# Patient Record
Sex: Female | Born: 1985 | Race: Black or African American | Hispanic: No | Marital: Single | Smoking: Former smoker
Health system: Southern US, Community
[De-identification: ages and names within clinical notes are randomized; demographics above are authoritative.]

## PROBLEM LIST (undated history)

## (undated) ENCOUNTER — Inpatient Hospital Stay (HOSPITAL_COMMUNITY): Payer: Self-pay

## (undated) DIAGNOSIS — G43909 Migraine, unspecified, not intractable, without status migrainosus: Secondary | ICD-10-CM

## (undated) DIAGNOSIS — F329 Major depressive disorder, single episode, unspecified: Secondary | ICD-10-CM

## (undated) DIAGNOSIS — F419 Anxiety disorder, unspecified: Secondary | ICD-10-CM

## (undated) DIAGNOSIS — D369 Benign neoplasm, unspecified site: Secondary | ICD-10-CM

## (undated) DIAGNOSIS — E282 Polycystic ovarian syndrome: Secondary | ICD-10-CM

## (undated) DIAGNOSIS — R87629 Unspecified abnormal cytological findings in specimens from vagina: Secondary | ICD-10-CM

## (undated) DIAGNOSIS — Z8249 Family history of ischemic heart disease and other diseases of the circulatory system: Secondary | ICD-10-CM

## (undated) DIAGNOSIS — E669 Obesity, unspecified: Secondary | ICD-10-CM

## (undated) DIAGNOSIS — F32A Depression, unspecified: Secondary | ICD-10-CM

## (undated) DIAGNOSIS — I10 Essential (primary) hypertension: Secondary | ICD-10-CM

## (undated) DIAGNOSIS — J302 Other seasonal allergic rhinitis: Secondary | ICD-10-CM

## (undated) HISTORY — PX: ADENOIDECTOMY: SUR15

## (undated) HISTORY — DX: Essential (primary) hypertension: I10

## (undated) HISTORY — DX: Family history of ischemic heart disease and other diseases of the circulatory system: Z82.49

## (undated) HISTORY — PX: TONSILLECTOMY: SUR1361

---

## 2007-03-27 ENCOUNTER — Other Ambulatory Visit: Admission: RE | Admit: 2007-03-27 | Discharge: 2007-03-27 | Payer: Self-pay | Admitting: Obstetrics and Gynecology

## 2008-10-05 ENCOUNTER — Emergency Department (HOSPITAL_COMMUNITY): Admission: EM | Admit: 2008-10-05 | Discharge: 2008-10-05 | Payer: Self-pay | Admitting: Emergency Medicine

## 2008-12-07 ENCOUNTER — Emergency Department (HOSPITAL_COMMUNITY): Admission: EM | Admit: 2008-12-07 | Discharge: 2008-12-07 | Payer: Self-pay | Admitting: Family Medicine

## 2009-10-27 ENCOUNTER — Emergency Department (HOSPITAL_COMMUNITY): Admission: EM | Admit: 2009-10-27 | Discharge: 2009-10-27 | Payer: Self-pay | Admitting: Family Medicine

## 2010-05-31 LAB — CULTURE, ROUTINE-ABSCESS

## 2010-07-11 ENCOUNTER — Ambulatory Visit (HOSPITAL_COMMUNITY): Payer: Self-pay

## 2010-08-14 ENCOUNTER — Inpatient Hospital Stay (INDEPENDENT_AMBULATORY_CARE_PROVIDER_SITE_OTHER)
Admission: RE | Admit: 2010-08-14 | Discharge: 2010-08-14 | Disposition: A | Discharge status: Home / Self Care | Payer: 59 | Source: Ambulatory Visit | Attending: Emergency Medicine | Admitting: Emergency Medicine

## 2010-08-14 DIAGNOSIS — L259 Unspecified contact dermatitis, unspecified cause: Secondary | ICD-10-CM

## 2011-05-27 ENCOUNTER — Encounter (HOSPITAL_COMMUNITY): Payer: Self-pay | Admitting: Emergency Medicine

## 2011-05-27 ENCOUNTER — Emergency Department (INDEPENDENT_AMBULATORY_CARE_PROVIDER_SITE_OTHER)
Admission: EM | Admit: 2011-05-27 | Discharge: 2011-05-27 | Disposition: A | Discharge status: Home / Self Care | Payer: 59 | Source: Home / Self Care | Attending: Emergency Medicine | Admitting: Emergency Medicine

## 2011-05-27 DIAGNOSIS — J329 Chronic sinusitis, unspecified: Secondary | ICD-10-CM

## 2011-05-27 HISTORY — DX: Obesity, unspecified: E66.9

## 2011-05-27 HISTORY — DX: Other seasonal allergic rhinitis: J30.2

## 2011-05-27 HISTORY — DX: Migraine, unspecified, not intractable, without status migrainosus: G43.909

## 2011-05-27 MED ORDER — PSEUDOEPHEDRINE-GUAIFENESIN ER 120-1200 MG PO TB12: 1.0000 | ORAL_TABLET | Freq: Two times a day (BID) | ORAL | Status: DC | PRN

## 2011-05-27 MED ORDER — FLUTICASONE PROPIONATE 50 MCG/ACT NA SUSP: 2.0000 | Freq: Every day | NASAL | Status: DC

## 2011-05-27 NOTE — ED Provider Notes (Signed)
History     CSN: [id number]  Arrival date & time 05/27/11  4098   First MD Initiated Contact with Patient 05/27/11 1817      Chief Complaint  Patient presents with  . URI    (Consider location/radiation/quality/duration/timing/severity/associated sxs/prior treatment) HPI Comments: Pt with sensation of sinus pressure, facial fullness for 2 months. States she feels like her nose is congested, but is "unable to get anything out". Mild postnasal drip, and irritated throat at the beginning. Symptoms are not aggravated by head movement. There are no other aggravating or alleviating symptoms. Patient initially tried Sudafed, states that "dried her up" and then switched to a nonsedating antihistamine without relief.  No coughing, ear pain, ear fullness, fevers, N/V, other HA, bodyaches, dental pain, purulent nasal d/c. Patient has a history seasonal allergies, allergic rhinitis. States that her allergies to get worse around this time of year.    ROS as noted in HPI. All other ROS negative.   Patient is a 26 y.o. female presenting with sinusitis. The history is provided by the patient. No language interpreter was used.  Sinusitis  This is a recurrent problem. The current episode started more than 1 week ago. The problem has not changed since onset.There has been no fever. Associated symptoms include congestion and sinus pressure. She has tried other medications for the symptoms.    Past Medical History  Diagnosis Date  . Seasonal allergies   . Migraines   . Obesity     Past Surgical History  Procedure Date  . Tonsillectomy   . Adenoidectomy     History reviewed. No pertinent family history.  History  Substance Use Topics  . Smoking status: Current Everyday Smoker  . Smokeless tobacco: Not on file  . Alcohol Use: Yes    OB History    Grav Para Term Preterm Abortions TAB SAB Ect Mult Living                  Review of Systems  HENT: Positive for congestion and sinus  pressure.     Allergies  Review of patient's allergies indicates no known allergies.  Home Medications   Current Outpatient Rx  Name Route Sig Dispense Refill  . FLUTICASONE PROPIONATE 50 MCG/ACT NA SUSP Nasal Place 2 sprays into the nose daily. 16 g 0  . PSEUDOEPHEDRINE-GUAIFENESIN ER 425 042 3803 MG PO TB12 Oral Take 1 tablet by mouth 2 (two) times daily as needed (congestion). 20 each 0    BP 130/97  Pulse 86  Temp(Src) 100 F (37.8 C) (Oral)  Resp 22  SpO2 100%  LMP 05/11/2011  Physical Exam  Nursing note and vitals reviewed. Constitutional: She is oriented to person, place, and time. She appears well-developed and well-nourished. No distress.  HENT:  Head: Normocephalic and atraumatic.  Right Ear: Tympanic membrane normal.  Left Ear: Tympanic membrane normal.  Nose: Mucosal edema present. No rhinorrhea or septal deviation. Right sinus exhibits no maxillary sinus tenderness and no frontal sinus tenderness. Left sinus exhibits no maxillary sinus tenderness and no frontal sinus tenderness.  Mouth/Throat: Uvula is midline, oropharynx is clear and moist and mucous membranes are normal.  Eyes: Conjunctivae and EOM are normal. Pupils are equal, round, and reactive to light.  Neck: Normal range of motion. Neck supple.  Cardiovascular: Normal rate, regular rhythm and normal heart sounds.   Pulmonary/Chest: Effort normal and breath sounds normal.  Abdominal: She exhibits no distension.  Musculoskeletal: Normal range of motion.  Neurological: She is alert and oriented  to person, place, and time.  Skin: Skin is warm and dry.  Psychiatric: She has a normal mood and affect. Her behavior is normal. Judgment and thought content normal.    ED Course  Procedures (including critical care time)  Labs Reviewed - No data to display No results found.   1. Sinusitis       MDM  No signs of bacterial infection, even though patient has been reporting symptoms of 2 months.. Patient has a  primary care physician for followup. Will send her home with Mucinex D, Flonase, saline nasal irrigation. She will follow with her primary care physician in 4-5 days for reevaluation and antibiotic prescription of that time if needed. Counseled patient on smoking cessation. Patient agrees with plan.  Luiz Blare, MD 05/27/11 2044

## 2011-05-27 NOTE — ED Notes (Signed)
Sinus pressure, pressure behind nose.  Blows nose, but nothing moves, tries hacking phlegm up, but will not move.  Patient reports sinus issues for 2 months.  No fever.  No nausea, no vomiting, no diarrhea.

## 2011-05-27 NOTE — Discharge Instructions (Signed)
Take the medication as written. Return if you get worse, have a fever >100.4, or for any concerns. You may take 600 mg of motrin with 1 gram of tylenol up to 4 times a day as needed for pain. This is an effective combination for pain. Use a neti pot or the NeilMed sinus rinse as often as you want to to reduce nasal congestion. Follow the directions on the box.  ° °Go to www.goodrx.com to look up your medications. This will give you a list of where you can find your prescriptions at the most affordable prices.  °

## 2011-08-30 ENCOUNTER — Ambulatory Visit: Payer: 59 | Admitting: Physician Assistant

## 2011-08-30 VITALS — BP 150/84 | HR 89 | Temp 99.5°F | Resp 18 | Ht 68.5 in | Wt 362.2 lb

## 2011-08-30 DIAGNOSIS — Z2089 Contact with and (suspected) exposure to other communicable diseases: Secondary | ICD-10-CM

## 2011-08-30 DIAGNOSIS — R350 Frequency of micturition: Secondary | ICD-10-CM

## 2011-08-30 DIAGNOSIS — Z202 Contact with and (suspected) exposure to infections with a predominantly sexual mode of transmission: Secondary | ICD-10-CM

## 2011-08-30 DIAGNOSIS — N898 Other specified noninflammatory disorders of vagina: Secondary | ICD-10-CM

## 2011-08-30 LAB — POCT URINALYSIS DIPSTICK
Bilirubin, UA: NEGATIVE
Blood, UA: NEGATIVE
Glucose, UA: NEGATIVE
Ketones, UA: NEGATIVE
Nitrite, UA: NEGATIVE
Protein, UA: NEGATIVE
Spec Grav, UA: 1.025
Urobilinogen, UA: 0.2
pH, UA: 6

## 2011-08-30 LAB — POCT UA - MICROSCOPIC ONLY
Bacteria, U Microscopic: 1
Casts, Ur, LPF, POC: NEGATIVE
Crystals, Ur, HPF, POC: NEGATIVE
Mucus, UA: NEGATIVE
Yeast, UA: NEGATIVE

## 2011-08-30 LAB — POCT WET PREP WITH KOH
KOH Prep POC: POSITIVE
RBC Wet Prep HPF POC: NEGATIVE
Trichomonas, UA: NEGATIVE
Yeast Wet Prep HPF POC: POSITIVE

## 2011-08-30 MED ORDER — FLUCONAZOLE 150 MG PO TABS: 150.0000 mg | ORAL_TABLET | Freq: Once | ORAL | Status: AC

## 2011-08-30 NOTE — Progress Notes (Signed)
Subjective:    Patient ID: Miranda Stewart, female    DOB: 11/13/1985, 26 y.o.   MRN: [id number]  HPI Patient presents with 1 week history of vaginal itching and some burning noticed mostly when she urinates. Says she was treated for a yeast infection about 1 month ago following antibiotic use. She used OTC Monistat which helped. After menses, symptoms restarted. Denies abdominal pain, nausea, vomiting, back pain, fever, chills, or vaginal discharge. No specific concern for STI's but wants to be tested today. She gets annual pap tests from her gynecologists.     Review of Systems  Constitutional: Negative for fever and chills.  Genitourinary: Positive for dysuria and frequency. Negative for hematuria, flank pain, vaginal bleeding, vaginal discharge and vaginal pain.  Skin: Negative for rash.       Objective:   Physical Exam  Constitutional: She is oriented to person, place, and time. She appears well-developed and well-nourished.  HENT:  Head: Normocephalic and atraumatic.  Right Ear: External ear normal.  Left Ear: External ear normal.  Eyes: Conjunctivae are normal.  Neck: Normal range of motion.  Cardiovascular: Normal rate, regular rhythm and normal heart sounds.   Pulmonary/Chest: Effort normal and breath sounds normal.  Abdominal: Soft. Bowel sounds are normal. There is no rebound.  Genitourinary: Uterus normal. Pelvic exam was performed with patient supine. There is no rash or tenderness on the right labia. There is no rash or tenderness on the left labia. Cervix exhibits no motion tenderness and no friability. Right adnexum displays no tenderness. Left adnexum displays no tenderness. Vaginal discharge (thin, whitish ) found.  Neurological: She is alert and oriented to person, place, and time.  Psychiatric: She has a normal mood and affect. Her behavior is normal. Judgment and thought content normal.    Results for orders placed in visit on 08/30/11  POCT WET PREP WITH KOH      Component Value Range   Trichomonas, UA Negative     Clue Cells Wet Prep HPF POC 1-4     Epithelial Wet Prep HPF POC 2-3     Yeast Wet Prep HPF POC positive     Bacteria Wet Prep HPF POC trace     RBC Wet Prep HPF POC neg     WBC Wet Prep HPF POC 6-8     KOH Prep POC Positive    POCT URINALYSIS DIPSTICK      Component Value Range   Color, UA yellow     Clarity, UA clear     Glucose, UA neg     Bilirubin, UA neg     Ketones, UA neg     Spec Grav, UA 1.025     Blood, UA neg     pH, UA 6.0     Protein, UA neg     Urobilinogen, UA 0.2     Nitrite, UA neg     Leukocytes, UA small (1+)    POCT UA - MICROSCOPIC ONLY      Component Value Range   WBC, Ur, HPF, POC 15-20     RBC, urine, microscopic 0-2     Bacteria, U Microscopic 1     Mucus, UA neg     Epithelial cells, urine per micros 3-8     Crystals, Ur, HPF, POC neg     Casts, Ur, LPF, POC neg'     Yeast, UA neg           Assessment & Plan:   1. Urinary  frequency  Will send urine culture. Treat if necessary.  POCT urinalysis dipstick, POCT UA - Microscopic Only  2. Leukorrhea  Will treat for yeast infection.  POCT Wet Prep with KOH  3. Contact with or exposure to venereal disease  GC/chlamydia probe amp, genital, HIV antibody, RPR

## 2011-08-31 LAB — HIV ANTIBODY (ROUTINE TESTING W REFLEX): HIV: NONREACTIVE

## 2011-09-01 LAB — GC/CHLAMYDIA PROBE AMP, GENITAL
Chlamydia, DNA Probe: NEGATIVE
GC Probe Amp, Genital: NEGATIVE

## 2011-09-01 LAB — RPR

## 2011-09-02 LAB — URINE CULTURE: Colony Count: 35000

## 2013-03-24 ENCOUNTER — Emergency Department (HOSPITAL_COMMUNITY)
Admission: EM | Admit: 2013-03-24 | Discharge: 2013-03-24 | Disposition: A | Discharge status: Home / Self Care | Payer: 59 | Attending: Emergency Medicine | Admitting: Emergency Medicine

## 2013-03-24 ENCOUNTER — Encounter (HOSPITAL_COMMUNITY): Payer: Self-pay | Admitting: Emergency Medicine

## 2013-03-24 DIAGNOSIS — L0291 Cutaneous abscess, unspecified: Secondary | ICD-10-CM

## 2013-03-24 DIAGNOSIS — Z79899 Other long term (current) drug therapy: Secondary | ICD-10-CM | POA: Insufficient documentation

## 2013-03-24 DIAGNOSIS — E669 Obesity, unspecified: Secondary | ICD-10-CM | POA: Insufficient documentation

## 2013-03-24 DIAGNOSIS — F17200 Nicotine dependence, unspecified, uncomplicated: Secondary | ICD-10-CM | POA: Insufficient documentation

## 2013-03-24 DIAGNOSIS — IMO0002 Reserved for concepts with insufficient information to code with codable children: Secondary | ICD-10-CM | POA: Insufficient documentation

## 2013-03-24 DIAGNOSIS — Z8679 Personal history of other diseases of the circulatory system: Secondary | ICD-10-CM | POA: Insufficient documentation

## 2013-03-24 MED ORDER — IBUPROFEN 800 MG PO TABS: 800.0000 mg | ORAL_TABLET | Freq: Three times a day (TID) | ORAL | Status: DC

## 2013-03-24 MED ORDER — HYDROCODONE-ACETAMINOPHEN 5-325 MG PO TABS
1.0000 | ORAL_TABLET | Freq: Once | ORAL | Status: AC
  Administered 2013-03-24: 1 via ORAL
  Filled 2013-03-24: qty 1

## 2013-03-24 NOTE — ED Provider Notes (Signed)
Medical screening examination/treatment/procedure(s) were conducted as a shared visit with non-physician practitioner(s) or resident  and myself.  I personally evaluated the patient during the encounter and agree with the findings and plan unless otherwise indicated.    I have personally reviewed any xrays and/ or EKG's with the provider and I agree with interpretation.   EMERGENCY DEPARTMENT US SOFT TISSUE INTERPRETATION "Study: Limited Soft Tissue Ultrasound"  INDICATIONS: Pain and Soft tissue infection Multiple views of the body part were obtained in real-time with a multi-frequency linear probe PERFORMED BY:  Myself IMAGES ARCHIVED?: Yes SIDE:Right  BODY PART:Axilla FINDINGS: Abcess present and Cellulitis absent INTERPRETATION:  Abcess present and No cellulitis noted    Hx of abscess. Worsening right axillary swelling/ pain. No fever/ vomiting or systemic sxs.  Pt on bactrim.  Exam fluctuant/ tender, mild erythema right axilla consistent with likely abscess.  Bedside US confirmed extent of fluid, I personally performed US and planned I and D with PA.  I and D in ER, no complications, fup closely outpt discussed.  Filed Vitals:   03/24/13 0914  BP: 144/68  Pulse: 100  Temp: 98.6 F (37 C)  TempSrc: Oral  Resp: 14  SpO2: 100%    Right axillary abscess  Mariea Clonts, MD 03/26/13 2010

## 2013-03-24 NOTE — ED Notes (Signed)
She states she has a right axilla abscess, which she has had for over a week now.  She states she went to a walk-in clinic this week and "they cut it and gave me antibiotics, but it's no better".  She is in no distress.

## 2013-03-24 NOTE — Discharge Instructions (Signed)
Call for a follow up appointment with a Family or Primary Care Provider.  Return to the ED or your Primary care for a wound check in 2-3 days. Return if Symptoms worsen.   Take medication as prescribed.  Continue taking your Bactrim.

## 2013-03-24 NOTE — ED Notes (Signed)
PA at bedside.

## 2013-03-24 NOTE — ED Provider Notes (Signed)
CSN: [id number]     Arrival date & time 03/24/13  0907 History   First MD Initiated Contact with Patient 03/24/13 [id number]     Chief Complaint  Patient presents with  . Abscess   (Consider location/radiation/quality/duration/timing/severity/associated sxs/prior Treatment) HPI Comments: Miranda Stewart is a 28 y.o. year-old female with a past medical history of abscess, obesity, seasonal allergies, presenting the Emergency Department with a chief complaint of worsening abscess abscess for 1.5 weeks.  The patient reports pain and swelling in the Right Axilla.  She reports going to a clinic for treatment 6 days ago, which was an unsuccessful I&D and was placed on Bactrim.  She reports she was compliant with the antibiotic but has persistent swelling and pain.  Denies fever, chills, nausea, vomiting.   Patient is a 28 y.o. female presenting with abscess. The history is provided by the patient and medical records. No language interpreter was used.  Abscess Associated symptoms: no fever     Past Medical History  Diagnosis Date  . Seasonal allergies   . Migraines   . Obesity    Past Surgical History  Procedure Laterality Date  . Tonsillectomy    . Adenoidectomy     History reviewed. No pertinent family history. History  Substance Use Topics  . Smoking status: Current Some Day Smoker    Types: Cigarettes  . Smokeless tobacco: Not on file  . Alcohol Use: Yes   OB History   Grav Para Term Preterm Abortions TAB SAB Ect Mult Living                 Review of Systems  Constitutional: Negative for fever and chills.  Skin: Positive for wound.    Allergies  Doxycycline  Home Medications   Current Outpatient Rx  Name  Route  Sig  Dispense  Refill  . ferrous fumarate (HEMOCYTE - 106 MG FE) 325 (106 FE) MG TABS   Oral   Take 1 tablet by mouth.         Gwenlyn Saran 3-0.02 MG tablet   Oral   Take 1 tablet by mouth daily.          Marland Kitchen sulfamethoxazole-trimethoprim (BACTRIM DS) 800-160  MG per tablet   Oral   Take 1 tablet by mouth 2 (two) times daily.          BP 144/68  Pulse 100  Temp(Src) 98.6 F (37 C) (Oral)  Resp 14  SpO2 100%  LMP 03/02/2013 Physical Exam  Nursing note and vitals reviewed. Constitutional: She appears well-developed and well-nourished.  HENT:  Head: Normocephalic and atraumatic.  Neck: Neck supple.  Cardiovascular: Normal rate.   Pulmonary/Chest: Effort normal. No respiratory distress.  Neurological: She is alert.  Skin: Skin is warm and dry.  Right Axilla: 4x3 cm area of fluctuence with associated 10x10cm area of induration. Minimal overlying erythema. Moderately tender to palpation  Psychiatric: She has a normal mood and affect. Her behavior is normal. Judgment and thought content normal.    ED Course  INCISION AND DRAINAGE Date/Time: 03/24/2013 11:46 AM Performed by: Lorrine Kin Authorized by: Lorrine Kin Consent: Verbal consent obtained. Risks and benefits: risks, benefits and alternatives were discussed Consent given by: patient Patient understanding: patient states understanding of the procedure being performed Patient consent: the patient's understanding of the procedure matches consent given Procedure consent: procedure consent matches procedure scheduled Imaging studies: imaging studies available Required items: required blood products, implants, devices, and special equipment available Patient identity  confirmed: verbally with patient Time out: Immediately prior to procedure a "time out" was called to verify the correct patient, procedure, equipment, support staff and site/side marked as required. Type: abscess Body area: upper extremity (Right Axilla) Anesthesia: local infiltration Local anesthetic: lidocaine 2% with epinephrine Anesthetic total: 20 ml Patient sedated: no Scalpel size: 11 Needle gauge: 22 Incision type: single straight Complexity: complex Drainage: purulent and bloody (Aproximately  25cc) Drainage amount: copious Wound treatment: wound left open Packing material: 1/4 in iodoform gauze Patient tolerance: Patient tolerated the procedure well with no immediate complications. Comments:     (including critical care time) Labs Review Labs Reviewed - No data to display Imaging Review No results found.  EKG Interpretation   None       MDM   1. Abscess    Pt with large abscess to right axilla, I&D with a large amount of purulent discharge.  Wound packed and wound check in 2-3 days.  Request non-narcotic as pain relief.  Discussed and treatment plan of wound check in 2-3 days with removal of packing with the patient. Return precautions given. Reports understanding and no other concerns at this time.  Patient is stable for discharge at this time.      Lorrine Kin, PA-C 03/28/13 1226

## 2013-03-29 NOTE — ED Provider Notes (Signed)
See separate note Medical screening examination/treatment/procedure(s) were conducted as a shared visit with non-physician practitioner(s) or resident  and myself.  I personally evaluated the patient during the encounter and agree with the findings and plan unless otherwise indicated.    I have personally reviewed any xrays and/ or EKG's with the provider and I agree with interpretation.     Mariea Clonts, MD 03/29/13 [phone]

## 2013-10-09 ENCOUNTER — Emergency Department (HOSPITAL_COMMUNITY)
Admission: EM | Admit: 2013-10-09 | Discharge: 2013-10-09 | Disposition: A | Discharge status: Home / Self Care | Payer: 59 | Attending: Emergency Medicine | Admitting: Emergency Medicine

## 2013-10-09 ENCOUNTER — Encounter (HOSPITAL_COMMUNITY): Payer: Self-pay | Admitting: Emergency Medicine

## 2013-10-09 DIAGNOSIS — Z8679 Personal history of other diseases of the circulatory system: Secondary | ICD-10-CM | POA: Diagnosis not present

## 2013-10-09 DIAGNOSIS — F17200 Nicotine dependence, unspecified, uncomplicated: Secondary | ICD-10-CM | POA: Diagnosis not present

## 2013-10-09 DIAGNOSIS — Z79899 Other long term (current) drug therapy: Secondary | ICD-10-CM | POA: Insufficient documentation

## 2013-10-09 DIAGNOSIS — Z3202 Encounter for pregnancy test, result negative: Secondary | ICD-10-CM | POA: Diagnosis not present

## 2013-10-09 DIAGNOSIS — R1013 Epigastric pain: Secondary | ICD-10-CM | POA: Insufficient documentation

## 2013-10-09 DIAGNOSIS — R112 Nausea with vomiting, unspecified: Secondary | ICD-10-CM | POA: Diagnosis not present

## 2013-10-09 DIAGNOSIS — R1012 Left upper quadrant pain: Secondary | ICD-10-CM | POA: Diagnosis not present

## 2013-10-09 DIAGNOSIS — E669 Obesity, unspecified: Secondary | ICD-10-CM | POA: Diagnosis not present

## 2013-10-09 LAB — LIPASE, BLOOD: Lipase: 25 U/L (ref 11–59)

## 2013-10-09 LAB — POC URINE PREG, ED: PREG TEST UR: NEGATIVE

## 2013-10-09 LAB — URINALYSIS, ROUTINE W REFLEX MICROSCOPIC
Bilirubin Urine: NEGATIVE
Glucose, UA: NEGATIVE mg/dL
HGB URINE DIPSTICK: NEGATIVE
KETONES UR: NEGATIVE mg/dL
LEUKOCYTES UA: NEGATIVE
Nitrite: NEGATIVE
PROTEIN: NEGATIVE mg/dL
Specific Gravity, Urine: 1.02 (ref 1.005–1.030)
UROBILINOGEN UA: 0.2 mg/dL (ref 0.0–1.0)
pH: 8 (ref 5.0–8.0)

## 2013-10-09 LAB — CBC WITH DIFFERENTIAL/PLATELET
Basophils Absolute: 0 10*3/uL (ref 0.0–0.1)
Basophils Relative: 0 % (ref 0–1)
EOS ABS: 0 10*3/uL (ref 0.0–0.7)
EOS PCT: 0 % (ref 0–5)
HEMATOCRIT: 40.1 % (ref 36.0–46.0)
HEMOGLOBIN: 13.9 g/dL (ref 12.0–15.0)
LYMPHS ABS: 2 10*3/uL (ref 0.7–4.0)
Lymphocytes Relative: 20 % (ref 12–46)
MCH: 27.1 pg (ref 26.0–34.0)
MCHC: 34.7 g/dL (ref 30.0–36.0)
MCV: 78.3 fL (ref 78.0–100.0)
MONO ABS: 0.5 10*3/uL (ref 0.1–1.0)
MONOS PCT: 5 % (ref 3–12)
Neutro Abs: 7.6 10*3/uL (ref 1.7–7.7)
Neutrophils Relative %: 75 % (ref 43–77)
PLATELETS: 325 10*3/uL (ref 150–400)
RBC: 5.12 MIL/uL — AB (ref 3.87–5.11)
RDW: 13.3 % (ref 11.5–15.5)
WBC: 10 10*3/uL (ref 4.0–10.5)

## 2013-10-09 LAB — COMPREHENSIVE METABOLIC PANEL
ALT: 31 U/L (ref 0–35)
ANION GAP: 15 (ref 5–15)
AST: 21 U/L (ref 0–37)
Albumin: 3.8 g/dL (ref 3.5–5.2)
Alkaline Phosphatase: 92 U/L (ref 39–117)
BUN: 8 mg/dL (ref 6–23)
CALCIUM: 9.6 mg/dL (ref 8.4–10.5)
CO2: 24 mEq/L (ref 19–32)
Chloride: 103 mEq/L (ref 96–112)
Creatinine, Ser: 0.67 mg/dL (ref 0.50–1.10)
GLUCOSE: 122 mg/dL — AB (ref 70–99)
Potassium: 4.6 mEq/L (ref 3.7–5.3)
Sodium: 142 mEq/L (ref 137–147)
TOTAL PROTEIN: 8.3 g/dL (ref 6.0–8.3)
Total Bilirubin: 0.4 mg/dL (ref 0.3–1.2)

## 2013-10-09 MED ORDER — PANTOPRAZOLE SODIUM 20 MG PO TBEC: 40.0000 mg | DELAYED_RELEASE_TABLET | Freq: Every day | ORAL | Status: DC

## 2013-10-09 MED ORDER — PROMETHAZINE HCL 25 MG/ML IJ SOLN
12.5000 mg | Freq: Once | INTRAMUSCULAR | Status: AC
  Administered 2013-10-09: 12.5 mg via INTRAVENOUS
  Filled 2013-10-09: qty 1

## 2013-10-09 MED ORDER — ONDANSETRON HCL 4 MG/2ML IJ SOLN
4.0000 mg | INTRAMUSCULAR | Status: AC
  Administered 2013-10-09: 4 mg via INTRAVENOUS
  Filled 2013-10-09: qty 2

## 2013-10-09 MED ORDER — FAMOTIDINE IN NACL 20-0.9 MG/50ML-% IV SOLN
20.0000 mg | Freq: Once | INTRAVENOUS | Status: AC
  Administered 2013-10-09: 20 mg via INTRAVENOUS
  Filled 2013-10-09: qty 50

## 2013-10-09 MED ORDER — SUCRALFATE 1 GM/10ML PO SUSP: 1.0000 g | Freq: Three times a day (TID) | ORAL | Status: DC

## 2013-10-09 MED ORDER — PROMETHAZINE HCL 25 MG PO TABS: 25.0000 mg | ORAL_TABLET | Freq: Four times a day (QID) | ORAL | Status: DC | PRN

## 2013-10-09 MED ORDER — SODIUM CHLORIDE 0.9 % IV BOLUS (SEPSIS)
1000.0000 mL | Freq: Once | INTRAVENOUS | Status: AC
  Administered 2013-10-09: 1000 mL via INTRAVENOUS

## 2013-10-09 MED ORDER — GI COCKTAIL ~~LOC~~
30.0000 mL | Freq: Once | ORAL | Status: AC
  Administered 2013-10-09: 30 mL via ORAL
  Filled 2013-10-09: qty 30

## 2013-10-09 NOTE — ED Notes (Signed)
Pt c/o upper abd pain with N/V starting today

## 2013-10-09 NOTE — Discharge Instructions (Signed)
Take phenergan as needed for nausea to prevent vomiting. Take Protonix daily as prescribed. Take Carafate before meals. Follow up with your primary care doctor. Avoid fatty/greasy/fried foods.  Peptic Ulcer A peptic ulcer is a sore in the lining of your esophagus (esophageal ulcer), stomach (gastric ulcer), or in the first part of your small intestine (duodenal ulcer). The ulcer causes erosion into the deeper tissue. CAUSES  Normally, the lining of the stomach and the small intestine protects itself from the acid that digests food. The protective lining can be damaged by:  An infection caused by a bacterium called Helicobacter pylori (H. pylori).  Regular use of nonsteroidal anti-inflammatory drugs (NSAIDs), such as ibuprofen or aspirin.  Smoking tobacco. Other risk factors include being older than 67, drinking alcohol excessively, and having a family history of ulcer disease.  SYMPTOMS   Burning pain or gnawing in the area between the chest and the belly button.  Heartburn.  Nausea and vomiting.  Bloating. The pain can be worse on an empty stomach and at night. If the ulcer results in bleeding, it can cause:  Black, tarry stools.  Vomiting of bright red blood.  Vomiting of coffee-ground-looking materials. DIAGNOSIS  A diagnosis is usually made based upon your history and an exam. Other tests and procedures may be performed to find the cause of the ulcer. Finding a cause will help determine the best treatment. Tests and procedures may include:  Blood tests, stool tests, or breath tests to check for the bacterium H. pylori.  An upper gastrointestinal (GI) series of the esophagus, stomach, and small intestine.  An endoscopy to examine the esophagus, stomach, and small intestine.  A biopsy. TREATMENT  Treatment may include:  Eliminating the cause of the ulcer, such as smoking, NSAIDs, or alcohol.  Medicines to reduce the amount of acid in your digestive tract.  Antibiotic  medicines if the ulcer is caused by the H. pylori bacterium.  An upper endoscopy to treat a bleeding ulcer.  Surgery if the bleeding is severe or if the ulcer created a hole somewhere in the digestive system. HOME CARE INSTRUCTIONS   Avoid tobacco, alcohol, and caffeine. Smoking can increase the acid in the stomach, and continued smoking will impair the healing of ulcers.  Avoid foods and drinks that seem to cause discomfort or aggravate your ulcer.  Only take medicines as directed by your caregiver. Do not substitute over-the-counter medicines for prescription medicines without talking to your caregiver.  Keep any follow-up appointments and tests as directed. SEEK MEDICAL CARE IF:   Your do not improve within 7 days of starting treatment.  You have ongoing indigestion or heartburn. SEEK IMMEDIATE MEDICAL CARE IF:   You have sudden, sharp, or persistent abdominal pain.  You have bloody or dark black, tarry stools.  You vomit blood or vomit that looks like coffee grounds.  You become light-headed, weak, or feel faint.  You become sweaty or clammy. MAKE SURE YOU:   Understand these instructions.  Will watch your condition.  Will get help right away if you are not doing well or get worse. Document Released: 02/06/2000 Document Revised: 06/25/2013 Document Reviewed: 09/08/2011 [facility] Patient Information 2015 [place], [place]. This information is not intended to replace advice given to you by your health care provider. Make sure you discuss any questions you have with your health care provider.

## 2013-10-09 NOTE — ED Notes (Signed)
Pt reports upper left quadrant pain that started this morning. Pt states she has nausea and vomiting, 4 episodes since this morning.

## 2013-10-09 NOTE — ED Provider Notes (Signed)
CSN: [id number]     Arrival date & time 10/09/13  1901 History   First MD Initiated Contact with Patient 10/09/13 2123     Chief Complaint  Patient presents with  . Abdominal Pain  . Emesis    (Consider location/radiation/quality/duration/timing/severity/associated sxs/prior Treatment) HPI Comments: Patient is a 28 year old female with a history of seasonal allergies and migraines who presents to the emergency department for abdominal pain with associated nausea and vomiting. Patient states that around noontime today she began to experience a "gnawing" pain in her epigastric region. Patient states that she ate a peach and some animal crackers with some mild improvement in her symptoms. She states that approximately one hour later her symptoms returned around lunchtime. Patient states that she thought the pain was secondary to hunger; therefore, she ate a [employer] sandwhich. Patient states that after eating this, she developed severe nausea and vomiting. Pain has also continued with radiation to her chest sporadically. She has had approximately 4 episodes of nonbloody/nonbilious emesis the last of which was 2 hours ago. Patient has had a bowel movement today which was normal in consistency and free of blood. She denies any melanotic stool. She further denies associated fever, chest pain, shortness of breath, urinary symptoms, vaginal complaints, and syncope. No hx of abdominal surgeries.  Patient is a 28 y.o. female presenting with abdominal pain and vomiting. The history is provided by the patient. No language interpreter was used.  Abdominal Pain Associated symptoms: nausea and vomiting   Associated symptoms: no chest pain, no diarrhea, no dysuria, no fever, no hematuria, no shortness of breath, no vaginal bleeding and no vaginal discharge   Emesis Associated symptoms: abdominal pain   Associated symptoms: no diarrhea     Past Medical History  Diagnosis Date  . Seasonal allergies   .  Migraines   . Obesity    Past Surgical History  Procedure Laterality Date  . Tonsillectomy    . Adenoidectomy     History reviewed. No pertinent family history. History  Substance Use Topics  . Smoking status: Current Some Day Smoker    Types: Cigarettes  . Smokeless tobacco: Not on file  . Alcohol Use: Yes   OB History   Grav Para Term Preterm Abortions TAB SAB Ect Mult Living                  Review of Systems  Constitutional: Negative for fever.  Respiratory: Negative for shortness of breath.   Cardiovascular: Negative for chest pain.  Gastrointestinal: Positive for nausea, vomiting and abdominal pain. Negative for diarrhea and blood in stool.  Genitourinary: Negative for dysuria, hematuria, vaginal bleeding and vaginal discharge.  All other systems reviewed and are negative.    Allergies  Doxycycline  Home Medications   Prior to Admission medications   Medication Sig Start Date End Date Taking? Authorizing Provider  pantoprazole (PROTONIX) 20 MG tablet Take 2 tablets (40 mg total) by mouth daily. 10/09/13   Antonietta Breach, PA-C  promethazine (PHENERGAN) 25 MG tablet Take 1 tablet (25 mg total) by mouth every 6 (six) hours as needed for nausea or vomiting. 10/09/13   Antonietta Breach, PA-C  sucralfate (CARAFATE) 1 GM/10ML suspension Take 10 mLs (1 g total) by mouth 4 (four) times daily -  with meals and at bedtime. 10/09/13   Antonietta Breach, PA-C   BP 150/56  Pulse 55  Temp(Src) 98.6 F (37 C) (Oral)  Resp 15  SpO2 99%  Physical Exam  Nursing note and  vitals reviewed. Constitutional: She is oriented to person, place, and time. She appears well-developed and well-nourished. No distress.  Nontoxic/nonseptic appearing  HENT:  Head: Normocephalic and atraumatic.  Eyes: Conjunctivae and EOM are normal. No scleral icterus.  Neck: Normal range of motion.  Cardiovascular: Normal rate, regular rhythm and intact distal pulses.   Pulmonary/Chest: Effort normal. No respiratory  distress. She has no wheezes.  Chest expansion symmetric  Abdominal: Soft. There is tenderness. There is no rebound and no guarding.  Soft morbidly obese abdomen with tenderness to palpation in the epigastric and left upper quadrant. No rebound tenderness or guarding. No peritoneal signs or masses. Negative Murphy sign.  Musculoskeletal: Normal range of motion.  Neurological: She is alert and oriented to person, place, and time. She exhibits normal muscle tone. Coordination normal.  GCS 15. Patient moves extremities without ataxia.  Skin: Skin is warm and dry. No rash noted. She is not diaphoretic. No erythema. No pallor.  Psychiatric: She has a normal mood and affect. Her behavior is normal.    ED Course  Procedures (including critical care time) Labs Review Labs Reviewed  CBC WITH DIFFERENTIAL - Abnormal; Notable for the following:    RBC 5.12 (*)    All other components within normal limits  COMPREHENSIVE METABOLIC PANEL - Abnormal; Notable for the following:    Glucose, Bld 122 (*)    All other components within normal limits  LIPASE, BLOOD  URINALYSIS, ROUTINE W REFLEX MICROSCOPIC  POC URINE PREG, ED    Imaging Review No results found.   EKG Interpretation None      MDM   Final diagnoses:  Epigastric abdominal pain  Non-intractable vomiting with nausea, vomiting of unspecified type    28 year old female presents to the emergency department for epigastric and left upper abdominal pain with associated nausea and vomiting. Patient's last bowel movement was today and was free of blood and normal in consistency. Patient well and nontoxic appearing, hemodynamically stable, and afebrile. Physical exam significant for focal tenderness in the epigastric region and left upper quadrant. No peritoneal signs or guarding. Negative Murphy sign. No tenderness to palpation at McBurney's point.  Patient today noted to have no leukocytosis or anemia. No electrolyte imbalance and liver  and kidney function preserved. Lipase normal. Urine pregnancy is negative and urinalysis does not suggest infection. Patient endorsing improvement in symptoms with IV fluids, Pepcid, and GI cocktail. Patient also given Zofran and Phenergan for nausea. Patient is now tolerating fluids by mouth without emesis. Abdominal reexamination improved from prior; still mild tenderness in epigastrium noted.  Given staple abdominal reexaminations and unremarkable laboratory workup, do not believe further emergent workup is indicated. Symptoms suggestive of esophageal reflux or ulcer. Will treat patient as an outpatient with Protonix and Carafate. Phenergan prescribed for nausea as needed. Patient advised to follow up with her primary care provider for a recheck and to ensure resolution of symptoms. Return precautions discussed and provided. Patient agreeable to plan of unaddressed concerns.   Filed Vitals:   10/09/13 1905 10/09/13 2347  BP: 141/93 150/56  Pulse: 73 55  Temp: 98.6 F (37 C)   TempSrc: Oral   Resp: 20 15  SpO2: 98% 99%     Antonietta Breach, PA-C 10/10/13 0018

## 2013-10-09 NOTE — ED Notes (Signed)
Pt given water to drink, was able to keep fluids down with no problem.

## 2013-10-09 NOTE — ED Notes (Signed)
Discharge instructions reviewed with pt. Pt verbalized understanding.   

## 2013-10-10 NOTE — ED Provider Notes (Signed)
Medical screening examination/treatment/procedure(s) were performed by non-physician practitioner and as supervising physician I was immediately available for consultation/collaboration.   EKG Interpretation None        Wandra Arthurs, MD 10/10/13 1015

## 2014-07-27 ENCOUNTER — Emergency Department (HOSPITAL_COMMUNITY)
Admission: EM | Admit: 2014-07-27 | Discharge: 2014-07-27 | Disposition: A | Discharge status: Home / Self Care | Payer: 59 | Attending: Emergency Medicine | Admitting: Emergency Medicine

## 2014-07-27 ENCOUNTER — Encounter (HOSPITAL_COMMUNITY): Payer: Self-pay | Admitting: *Deleted

## 2014-07-27 DIAGNOSIS — Z720 Tobacco use: Secondary | ICD-10-CM | POA: Diagnosis not present

## 2014-07-27 DIAGNOSIS — R1012 Left upper quadrant pain: Secondary | ICD-10-CM | POA: Insufficient documentation

## 2014-07-27 DIAGNOSIS — Z8679 Personal history of other diseases of the circulatory system: Secondary | ICD-10-CM | POA: Insufficient documentation

## 2014-07-27 DIAGNOSIS — Z3202 Encounter for pregnancy test, result negative: Secondary | ICD-10-CM | POA: Insufficient documentation

## 2014-07-27 DIAGNOSIS — E669 Obesity, unspecified: Secondary | ICD-10-CM | POA: Insufficient documentation

## 2014-07-27 DIAGNOSIS — R1013 Epigastric pain: Secondary | ICD-10-CM | POA: Diagnosis not present

## 2014-07-27 DIAGNOSIS — R001 Bradycardia, unspecified: Secondary | ICD-10-CM | POA: Diagnosis not present

## 2014-07-27 DIAGNOSIS — R197 Diarrhea, unspecified: Secondary | ICD-10-CM | POA: Diagnosis not present

## 2014-07-27 DIAGNOSIS — R112 Nausea with vomiting, unspecified: Secondary | ICD-10-CM | POA: Diagnosis not present

## 2014-07-27 LAB — URINALYSIS, ROUTINE W REFLEX MICROSCOPIC
Glucose, UA: NEGATIVE mg/dL
Hgb urine dipstick: NEGATIVE
Ketones, ur: 15 mg/dL — AB
Nitrite: NEGATIVE
Protein, ur: 30 mg/dL — AB
Specific Gravity, Urine: 1.035 — ABNORMAL HIGH (ref 1.005–1.030)
Urobilinogen, UA: 1 mg/dL (ref 0.0–1.0)
pH: 6 (ref 5.0–8.0)

## 2014-07-27 LAB — CBC WITH DIFFERENTIAL/PLATELET
Basophils Absolute: 0 10*3/uL (ref 0.0–0.1)
Basophils Relative: 0 % (ref 0–1)
EOS PCT: 0 % (ref 0–5)
Eosinophils Absolute: 0 10*3/uL (ref 0.0–0.7)
HEMATOCRIT: 36 % (ref 36.0–46.0)
HEMOGLOBIN: 12.7 g/dL (ref 12.0–15.0)
Lymphocytes Relative: 34 % (ref 12–46)
Lymphs Abs: 2.9 10*3/uL (ref 0.7–4.0)
MCH: 26.8 pg (ref 26.0–34.0)
MCHC: 35.3 g/dL (ref 30.0–36.0)
MCV: 75.9 fL — AB (ref 78.0–100.0)
MONOS PCT: 10 % (ref 3–12)
Monocytes Absolute: 0.8 10*3/uL (ref 0.1–1.0)
Neutro Abs: 4.8 10*3/uL (ref 1.7–7.7)
Neutrophils Relative %: 56 % (ref 43–77)
PLATELETS: 303 10*3/uL (ref 150–400)
RBC: 4.74 MIL/uL (ref 3.87–5.11)
RDW: 13.5 % (ref 11.5–15.5)
WBC: 8.5 10*3/uL (ref 4.0–10.5)

## 2014-07-27 LAB — COMPREHENSIVE METABOLIC PANEL
ALT: 43 U/L (ref 14–54)
AST: 35 U/L (ref 15–41)
Albumin: 3.5 g/dL (ref 3.5–5.0)
Alkaline Phosphatase: 76 U/L (ref 38–126)
Anion gap: 10 (ref 5–15)
BUN: 10 mg/dL (ref 6–20)
CO2: 24 mmol/L (ref 22–32)
CREATININE: 0.89 mg/dL (ref 0.44–1.00)
Calcium: 8.8 mg/dL — ABNORMAL LOW (ref 8.9–10.3)
Chloride: 107 mmol/L (ref 101–111)
GFR calc Af Amer: >60 mL/min (ref >60)
GFR calc non Af Amer: >60 mL/min (ref >60)
Glucose, Bld: 118 mg/dL — ABNORMAL HIGH (ref 65–99)
Potassium: 3.4 mmol/L — ABNORMAL LOW (ref 3.5–5.1)
Sodium: 141 mmol/L (ref 135–145)
Total Bilirubin: 0.7 mg/dL (ref 0.3–1.2)
Total Protein: 7.4 g/dL (ref 6.5–8.1)

## 2014-07-27 LAB — URINE MICROSCOPIC-ADD ON

## 2014-07-27 LAB — TSH: TSH: 0.674 u[IU]/mL (ref 0.350–4.500)

## 2014-07-27 LAB — MAGNESIUM: MAGNESIUM: 1.8 mg/dL (ref 1.7–2.4)

## 2014-07-27 LAB — POC URINE PREG, ED: Preg Test, Ur: NEGATIVE

## 2014-07-27 LAB — LIPASE, BLOOD: LIPASE: 15 U/L — AB (ref 22–51)

## 2014-07-27 MED ORDER — ONDANSETRON HCL 4 MG/2ML IJ SOLN
4.0000 mg | Freq: Once | INTRAMUSCULAR | Status: AC
  Administered 2014-07-27: 4 mg via INTRAVENOUS
  Filled 2014-07-27: qty 2

## 2014-07-27 MED ORDER — METOCLOPRAMIDE HCL 5 MG/ML IJ SOLN
10.0000 mg | Freq: Once | INTRAMUSCULAR | Status: AC
  Administered 2014-07-27: 10 mg via INTRAVENOUS
  Filled 2014-07-27: qty 2

## 2014-07-27 MED ORDER — SODIUM CHLORIDE 0.9 % IV BOLUS (SEPSIS)
1000.0000 mL | Freq: Once | INTRAVENOUS | Status: AC
  Administered 2014-07-27: 1000 mL via INTRAVENOUS

## 2014-07-27 MED ORDER — DIPHENOXYLATE-ATROPINE 2.5-0.025 MG PO TABS
1.0000 | ORAL_TABLET | Freq: Once | ORAL | Status: DC
  Filled 2014-07-27: qty 1

## 2014-07-27 MED ORDER — ONDANSETRON 8 MG PO TBDP: 8.0000 mg | ORAL_TABLET | Freq: Three times a day (TID) | ORAL | Status: DC | PRN

## 2014-07-27 MED ORDER — PROMETHAZINE HCL 25 MG/ML IJ SOLN
12.5000 mg | Freq: Once | INTRAMUSCULAR | Status: AC
  Administered 2014-07-27: 12.5 mg via INTRAVENOUS
  Filled 2014-07-27: qty 1

## 2014-07-27 MED ORDER — POTASSIUM CHLORIDE CRYS ER 20 MEQ PO TBCR
40.0000 meq | EXTENDED_RELEASE_TABLET | Freq: Once | ORAL | Status: AC
  Administered 2014-07-27: 40 meq via ORAL
  Filled 2014-07-27: qty 2

## 2014-07-27 MED ORDER — PROMETHAZINE HCL 25 MG RE SUPP: 25.0000 mg | Freq: Four times a day (QID) | RECTAL | Status: DC | PRN

## 2014-07-27 NOTE — ED Provider Notes (Signed)
CSN: [id number]     Arrival date & time 07/27/14  0845 History   First MD Initiated Contact with Patient 07/27/14 [phone]     Chief Complaint  Patient presents with  . Emesis  . Diarrhea     (Consider location/radiation/quality/duration/timing/severity/associated sxs/prior Treatment) HPI LESLYN MONDA is a 29 y.o. female with hx of migraines, presents to ED with n/v/d. States started 2 days ago after eating a salad. States nausea and vomiting daily. Multiple episodes of diarrhea. Pain mainly in left upper quadrant. Unable to keep anything down. Reports generalized malaise and weakness. Reports hx of gastric ulcers, but this does not feel the same. No medications taken at home.   Past Medical History  Diagnosis Date  . Seasonal allergies   . Migraines   . Obesity    Past Surgical History  Procedure Laterality Date  . Tonsillectomy    . Adenoidectomy     History reviewed. No pertinent family history. History  Substance Use Topics  . Smoking status: Current Some Day Smoker    Types: Cigarettes  . Smokeless tobacco: Not on file  . Alcohol Use: Yes   OB History    No data available     Review of Systems  Constitutional: Negative for fever and chills.  Respiratory: Negative for cough, chest tightness and shortness of breath.   Cardiovascular: Negative for chest pain, palpitations and leg swelling.  Gastrointestinal: Positive for nausea, vomiting, abdominal pain and diarrhea.  Genitourinary: Negative for dysuria, flank pain, vaginal bleeding, vaginal discharge, vaginal pain and pelvic pain.  Musculoskeletal: Negative for myalgias, arthralgias, neck pain and neck stiffness.  Skin: Negative for rash.  Neurological: Negative for dizziness, weakness and headaches.  All other systems reviewed and are negative.     Allergies  Doxycycline  Home Medications   Prior to Admission medications   Medication Sig Start Date End Date Taking? Authorizing Provider  pantoprazole  (PROTONIX) 20 MG tablet Take 2 tablets (40 mg total) by mouth daily. 10/09/13   Antonietta Breach, PA-C  promethazine (PHENERGAN) 25 MG tablet Take 1 tablet (25 mg total) by mouth every 6 (six) hours as needed for nausea or vomiting. 10/09/13   Antonietta Breach, PA-C  sucralfate (CARAFATE) 1 GM/10ML suspension Take 10 mLs (1 g total) by mouth 4 (four) times daily -  with meals and at bedtime. 10/09/13   Antonietta Breach, PA-C   LMP 07/14/2014 Physical Exam  Constitutional: She is oriented to person, place, and time. She appears well-developed and well-nourished. No distress.  HENT:  Head: Normocephalic.  Eyes: Conjunctivae are normal.  Neck: Normal range of motion. Neck supple.  Cardiovascular: Regular rhythm and normal heart sounds.   bradycardic  Pulmonary/Chest: Effort normal and breath sounds normal. No respiratory distress. She has no wheezes. She has no rales.  Abdominal: Soft. Bowel sounds are normal. She exhibits no distension. There is tenderness. There is no rebound.  LUQ tenderness, epigastric tenderness, no guarding  Musculoskeletal: She exhibits no edema.  Neurological: She is alert and oriented to person, place, and time.  Skin: Skin is warm and dry.  Psychiatric: She has a normal mood and affect. Her behavior is normal.  Nursing note and vitals reviewed.   ED Course  Procedures (including critical care time) Labs Review Labs Reviewed  CBC WITH DIFFERENTIAL/PLATELET - Abnormal; Notable for the following:    MCV 75.9 (*)    All other components within normal limits  COMPREHENSIVE METABOLIC PANEL - Abnormal; Notable for the following:  Potassium 3.4 (*)    Glucose, Bld 118 (*)    Calcium 8.8 (*)    All other components within normal limits  LIPASE, BLOOD - Abnormal; Notable for the following:    Lipase 15 (*)    All other components within normal limits  URINALYSIS, ROUTINE W REFLEX MICROSCOPIC (NOT AT [facility]) - Abnormal; Notable for the following:    Color, Urine AMBER (*)     APPearance TURBID (*)    Specific Gravity, Urine 1.035 (*)    Bilirubin Urine SMALL (*)    Ketones, ur 15 (*)    Protein, ur 30 (*)    Leukocytes, UA SMALL (*)    All other components within normal limits  URINE MICROSCOPIC-ADD ON - Abnormal; Notable for the following:    Squamous Epithelial / LPF MANY (*)    Bacteria, UA MANY (*)    All other components within normal limits  TSH  MAGNESIUM  POC URINE PREG, ED  POC URINE PREG, ED    Imaging Review No results found.   EKG Interpretation   Date/Time:  Saturday July 27 2014 09:43:07 EDT Ventricular Rate:  48 PR Interval:  127 QRS Duration: 97 QT Interval:  497 QTC Calculation: 444 R Axis:   43 Text Interpretation:  Sinus bradycardia Ventricular premature complex Low  voltage, precordial leads No prior for comparison Confirmed by Mingo Amber  MD,  BLAIR ([id number]) on 07/27/2014 9:51:39 AM      MDM   Final diagnoses:  Nausea vomiting and diarrhea  Bradycardia    Patients with nausea, vomiting, diarrhea, upper abdominal pain 2 days ago. She tried taking Phenergan yesterday which helped some. She is unable to keep anything down at this time. We'll get labs, IV fluids ordered, IV Zofran ordered. Will try Lomotil for diarrhea. Patient was found to be bradycardic with heart rate dropping into the 30s, EKG ordered.  1:01 PM Patient is feeling much better after Phenergan and Reglan. She is no longer vomiting. She ambulated in the hallway to and from the bathroom, she denies any dizziness, lightheadedness, chest pain, shortness of breath. She continues to be bradycardic. Sinus bradycardia on the monitor. Patient will need close outpatient follow-up for this bradycardia. She is asymptomatic for it here, no further treatment indicated on emergent basis.   1:39 PM Pt continues to feel better. Drinking ginger ale. Will page cardiology given persistent bradycardia. TSH added.    Spoke with cardiology, asked for magnesium. Magnesium is back  unremarkable, patient will be discharged home. Cardiology will follow up regarding her bradycardia  Filed Vitals:   07/27/14 1445 07/27/14 1500 07/27/14 1600 07/27/14 1617  BP: 188/95 159/80 154/64   Pulse: 45 44 43   Temp:    98 F (36.7 C)  TempSrc:    Oral  Resp: 19 23 21    SpO2: 99% 97% 100%      Jeannett Senior, PA-C 07/28/14 1610  Evelina Bucy, MD 07/30/14 1941

## 2014-07-27 NOTE — Discharge Instructions (Signed)
Takes Zofran as prescribed as needed for nausea. Take Phenergan if no relief with Zofran. Drink plenty of fluids. Advance her diet tonight or tomorrow as tolerating. Follow-up with your doctor. Return if  worsening symptoms.  You will be contacted by cardiology for follow up regarding your heart rate. Return if any dizziness, weakness, lightheadiness.   Viral Gastroenteritis Viral gastroenteritis is also known as stomach flu. This condition affects the stomach and intestinal tract. It can cause sudden diarrhea and vomiting. The illness typically lasts 3 to 8 days. Most people develop an immune response that eventually gets rid of the virus. While this natural response develops, the virus can make you quite ill. CAUSES  Many different viruses can cause gastroenteritis, such as rotavirus or noroviruses. You can catch one of these viruses by consuming contaminated food or water. You may also catch a virus by sharing utensils or other personal items with an infected person or by touching a contaminated surface. SYMPTOMS  The most common symptoms are diarrhea and vomiting. These problems can cause a severe loss of body fluids (dehydration) and a body salt (electrolyte) imbalance. Other symptoms may include:  Fever.  Headache.  Fatigue.  Abdominal pain. DIAGNOSIS  Your caregiver can usually diagnose viral gastroenteritis based on your symptoms and a physical exam. A stool sample may also be taken to test for the presence of viruses or other infections. TREATMENT  This illness typically goes away on its own. Treatments are aimed at rehydration. The most serious cases of viral gastroenteritis involve vomiting so severely that you are not able to keep fluids down. In these cases, fluids must be given through an intravenous line (IV). HOME CARE INSTRUCTIONS   Drink enough fluids to keep your urine clear or pale yellow. Drink small amounts of fluids frequently and increase the amounts as  tolerated.  Ask your caregiver for specific rehydration instructions.  Avoid:  Foods high in sugar.  Alcohol.  Carbonated drinks.  Tobacco.  Juice.  Caffeine drinks.  Extremely hot or cold fluids.  Fatty, greasy foods.  Too much intake of anything at one time.  Dairy products until 24 to 48 hours after diarrhea stops.  You may consume probiotics. Probiotics are active cultures of beneficial bacteria. They may lessen the amount and number of diarrheal stools in adults. Probiotics can be found in yogurt with active cultures and in supplements.  Wash your hands well to avoid spreading the virus.  Only take over-the-counter or prescription medicines for pain, discomfort, or fever as directed by your caregiver. Do not give aspirin to children. Antidiarrheal medicines are not recommended.  Ask your caregiver if you should continue to take your regular prescribed and over-the-counter medicines.  Keep all follow-up appointments as directed by your caregiver. SEEK IMMEDIATE MEDICAL CARE IF:   You are unable to keep fluids down.  You do not urinate at least once every 6 to 8 hours.  You develop shortness of breath.  You notice blood in your stool or vomit. This may look like coffee grounds.  You have abdominal pain that increases or is concentrated in one small area (localized).  You have persistent vomiting or diarrhea.  You have a fever.  The patient is a child younger than 3 months, and he or she has a fever.  The patient is a child older than 3 months, and he or she has a fever and persistent symptoms.  The patient is a child older than 3 months, and he or she has a  fever and symptoms suddenly get worse.  The patient is a baby, and he or she has no tears when crying. MAKE SURE YOU:   Understand these instructions.  Will watch your condition.  Will get help right away if you are not doing well or get worse. Document Released: 02/08/2005 Document Revised:  05/03/2011 Document Reviewed: 11/25/2010 [facility] Patient Information 2015 [place], [place]. This information is not intended to replace advice given to you by your health care provider. Make sure you discuss any questions you have with your health care provider.    Bradycardia Bradycardia is a term for a heart rate (pulse) that, in adults, is slower than 60 beats per minute. A normal rate is 60 to 100 beats per minute. A heart rate below 60 beats per minute may be normal for some adults with healthy hearts. If the rate is too slow, the heart may have trouble pumping the volume of blood the body needs. If the heart rate gets too low, blood flow to the brain may be decreased and may make you feel lightheaded, dizzy, or faint. The heart has a natural pacemaker in the top of the heart called the SA node (sinoatrial or sinus node). This pacemaker sends out regular electrical signals to the muscle of the heart, telling the heart muscle when to beat (contract). The electrical signal travels from the upper parts of the heart (atria) through the AV node (atrioventricular node), to the lower chambers of the heart (ventricles). The ventricles squeeze, pumping the blood from your heart to your lungs and to the rest of your body. CAUSES   Problem with the heart's electrical system.  Problem with the heart's natural pacemaker.  Heart disease, damage, or infection.  Medications.  Problems with minerals and salts (electrolytes). SYMPTOMS   Fainting (syncope).  Fatigue and weakness.  Shortness of breath (dyspnea).  Chest pain (angina).  Drowsiness.  Confusion. DIAGNOSIS   An electrocardiogram (ECG) can help your caregiver determine the type of slow heart rate you have.  If the cause is not seen on an ECG, you may need to wear a heart monitor that records your heart rhythm for several hours or days.  Blood tests. TREATMENT   Electrolyte supplements.  Medications.  Withholding medication  which is causing a slow heart rate.  Pacemaker placement. SEEK IMMEDIATE MEDICAL CARE IF:   You feel lightheaded or faint.  You develop an irregular heart rate.  You feel chest pain or have trouble breathing. MAKE SURE YOU:   Understand these instructions.  Will watch your condition.  Will get help right away if you are not doing well or get worse. Document Released: 10/31/2001 Document Revised: 05/03/2011 Document Reviewed: 05/16/2013 [facility] Patient Information 2015 [place], [place]. This information is not intended to replace advice given to you by your health care provider. Make sure you discuss any questions you have with your health care provider.

## 2014-07-27 NOTE — ED Notes (Signed)
Pt reports having upset stomach since Thursday, pt had eaten and having n/v/d since then.

## 2015-09-15 ENCOUNTER — Ambulatory Visit (HOSPITAL_COMMUNITY)
Admission: EM | Admit: 2015-09-15 | Discharge: 2015-09-15 | Disposition: A | Discharge status: Home / Self Care | Payer: 59

## 2015-09-15 ENCOUNTER — Encounter (HOSPITAL_COMMUNITY): Payer: Self-pay | Admitting: Emergency Medicine

## 2015-09-15 DIAGNOSIS — M25561 Pain in right knee: Secondary | ICD-10-CM

## 2015-09-15 MED ORDER — NAPROXEN 500 MG PO TABS: 500.0000 mg | ORAL_TABLET | Freq: Two times a day (BID) | ORAL | 0 refills | Status: DC

## 2015-09-15 NOTE — ED Triage Notes (Signed)
The patient presented to the [facility] with a complaint of right knee pain secondary to a fall that occurred 3 weeks ago. The patient stated that her knee is better than when the fall occurred but still has pain and decreased range of motion.

## 2016-04-06 ENCOUNTER — Other Ambulatory Visit: Payer: Self-pay | Admitting: Obstetrics and Gynecology

## 2016-04-06 DIAGNOSIS — R1900 Intra-abdominal and pelvic swelling, mass and lump, unspecified site: Secondary | ICD-10-CM

## 2016-04-20 ENCOUNTER — Ambulatory Visit
Admission: RE | Admit: 2016-04-20 | Discharge: 2016-04-20 | Disposition: A | Discharge status: Home / Self Care | Payer: 59 | Source: Ambulatory Visit | Attending: Obstetrics and Gynecology | Admitting: Obstetrics and Gynecology

## 2016-04-20 DIAGNOSIS — R1900 Intra-abdominal and pelvic swelling, mass and lump, unspecified site: Secondary | ICD-10-CM

## 2016-04-22 ENCOUNTER — Other Ambulatory Visit: Payer: Self-pay | Admitting: Obstetrics and Gynecology

## 2016-04-22 DIAGNOSIS — R1900 Intra-abdominal and pelvic swelling, mass and lump, unspecified site: Secondary | ICD-10-CM

## 2016-05-04 ENCOUNTER — Ambulatory Visit
Admission: RE | Admit: 2016-05-04 | Discharge: 2016-05-04 | Disposition: A | Discharge status: Home / Self Care | Payer: 59 | Source: Ambulatory Visit | Attending: Obstetrics and Gynecology | Admitting: Obstetrics and Gynecology

## 2016-05-04 DIAGNOSIS — R1900 Intra-abdominal and pelvic swelling, mass and lump, unspecified site: Secondary | ICD-10-CM

## 2016-05-04 MED ORDER — GADOBENATE DIMEGLUMINE 529 MG/ML IV SOLN
20.0000 mL | Freq: Once | INTRAVENOUS | Status: AC | PRN
  Administered 2016-05-04: 20 mL via INTRAVENOUS

## 2016-05-27 ENCOUNTER — Encounter (HOSPITAL_COMMUNITY): Payer: Self-pay | Admitting: Emergency Medicine

## 2016-05-27 ENCOUNTER — Emergency Department (HOSPITAL_COMMUNITY)
Admission: EM | Admit: 2016-05-27 | Discharge: 2016-05-28 | Disposition: A | Discharge status: Home / Self Care | Payer: 59 | Attending: Emergency Medicine | Admitting: Emergency Medicine

## 2016-05-27 DIAGNOSIS — Z79899 Other long term (current) drug therapy: Secondary | ICD-10-CM | POA: Diagnosis not present

## 2016-05-27 DIAGNOSIS — K529 Noninfective gastroenteritis and colitis, unspecified: Secondary | ICD-10-CM | POA: Diagnosis not present

## 2016-05-27 DIAGNOSIS — R112 Nausea with vomiting, unspecified: Secondary | ICD-10-CM | POA: Diagnosis present

## 2016-05-27 DIAGNOSIS — F17210 Nicotine dependence, cigarettes, uncomplicated: Secondary | ICD-10-CM | POA: Diagnosis not present

## 2016-05-27 LAB — CBC
HEMATOCRIT: 40.6 % (ref 36.0–46.0)
HEMOGLOBIN: 14.1 g/dL (ref 12.0–15.0)
MCH: 27.2 pg (ref 26.0–34.0)
MCHC: 34.7 g/dL (ref 30.0–36.0)
MCV: 78.4 fL (ref 78.0–100.0)
Platelets: 272 10*3/uL (ref 150–400)
RBC: 5.18 MIL/uL — ABNORMAL HIGH (ref 3.87–5.11)
RDW: 13.2 % (ref 11.5–15.5)
WBC: 10.2 10*3/uL (ref 4.0–10.5)

## 2016-05-27 LAB — I-STAT BETA HCG BLOOD, ED (MC, WL, AP ONLY): I-stat hCG, quantitative: <5 m[IU]/mL (ref <5)

## 2016-05-27 LAB — COMPREHENSIVE METABOLIC PANEL
ALBUMIN: 4 g/dL (ref 3.5–5.0)
ALT: 34 U/L (ref 14–54)
ANION GAP: 10 (ref 5–15)
AST: 29 U/L (ref 15–41)
Alkaline Phosphatase: 78 U/L (ref 38–126)
BILIRUBIN TOTAL: 0.8 mg/dL (ref 0.3–1.2)
BUN: 8 mg/dL (ref 6–20)
CHLORIDE: 104 mmol/L (ref 101–111)
CO2: 23 mmol/L (ref 22–32)
Calcium: 9.2 mg/dL (ref 8.9–10.3)
Creatinine, Ser: 0.9 mg/dL (ref 0.44–1.00)
GFR calc Af Amer: >60 mL/min (ref >60)
GFR calc non Af Amer: >60 mL/min (ref >60)
GLUCOSE: 141 mg/dL — AB (ref 65–99)
POTASSIUM: 4.2 mmol/L (ref 3.5–5.1)
SODIUM: 137 mmol/L (ref 135–145)
TOTAL PROTEIN: 8.1 g/dL (ref 6.5–8.1)

## 2016-05-27 LAB — LIPASE, BLOOD: LIPASE: 11 U/L (ref 11–51)

## 2016-05-27 MED ORDER — PROMETHAZINE HCL 25 MG/ML IJ SOLN
25.0000 mg | Freq: Once | INTRAMUSCULAR | Status: AC
  Administered 2016-05-27: 25 mg via INTRAVENOUS
  Filled 2016-05-27: qty 1

## 2016-05-27 MED ORDER — ONDANSETRON 4 MG PO TBDP
4.0000 mg | ORAL_TABLET | Freq: Once | ORAL | Status: AC
  Administered 2016-05-27: 4 mg via ORAL

## 2016-05-27 MED ORDER — ONDANSETRON 4 MG PO TBDP: 4.0000 mg | ORAL_TABLET | Freq: Once | ORAL | Status: DC | PRN

## 2016-05-27 MED ORDER — ONDANSETRON 4 MG PO TBDP
ORAL_TABLET | ORAL | Status: AC
  Filled 2016-05-27: qty 1

## 2016-05-27 NOTE — ED Triage Notes (Signed)
Patient reports upper abdominal pain with nausea / emesis after eating supper this evening , denies fever or diarrhea .

## 2016-05-28 MED ORDER — PROMETHAZINE HCL 25 MG RE SUPP: 25.0000 mg | Freq: Four times a day (QID) | RECTAL | 0 refills | Status: DC | PRN

## 2016-05-28 MED ORDER — PROMETHAZINE HCL 25 MG PO TABS: 25.0000 mg | ORAL_TABLET | Freq: Three times a day (TID) | ORAL | 0 refills | Status: DC | PRN

## 2016-05-28 MED ORDER — SODIUM CHLORIDE 0.9 % IV BOLUS (SEPSIS)
1000.0000 mL | Freq: Once | INTRAVENOUS | Status: AC
  Administered 2016-05-28: 1000 mL via INTRAVENOUS

## 2016-05-28 MED ORDER — ONDANSETRON 4 MG PO TBDP: ORAL_TABLET | ORAL | 0 refills | Status: DC

## 2016-05-28 NOTE — ED Provider Notes (Signed)
[facility] DEPT Provider Note   CSN: [id number] Arrival date & time: 05/27/16  2001     History   Chief Complaint Chief Complaint  Patient presents with  . Abdominal Pain  . Emesis    HPI Miranda Stewart is a 31 y.o. female.  HPI Patient presents to the emergency department with nausea, vomiting and abdominal discomfort that started earlier this evening.  The patient states she ate at [employer] [employer] [employer] and around 7 PM started having nausea and vomiting.  The patient states that she has a gnawing sensation in her stomach region.  The patient states she did not take any medications prior to arrival.  She is up and seems make the condition better.  She states that eating seems make the condition worse.  Patient was given Zofran here in the emergency department prior to being brought to a room and vomited after getting this medication.The patient denies chest pain, shortness of breath, headache,blurred vision, neck pain, fever, cough, weakness, numbness, dizziness, anorexia, edema,diarrhea, rash, back pain, dysuria, hematemesis, bloody stool, near syncope, or syncope. Past Medical History:  Diagnosis Date  . Migraines   . Obesity   . Seasonal allergies     There are no active problems to display for this patient.   Past Surgical History:  Procedure Laterality Date  . ADENOIDECTOMY    . TONSILLECTOMY      OB History    No data available       Home Medications    Prior to Admission medications   Medication Sig Start Date End Date Taking? Authorizing Provider  amphetamine-dextroamphetamine (ADDERALL XR) 20 MG 24 hr capsule Take 20 mg by mouth daily.    Historical Provider, MD  naproxen (NAPROSYN) 500 MG tablet Take 1 tablet (500 mg total) by mouth 2 (two) times daily with a meal. 09/15/15   Lysbeth Penner, FNP  ondansetron (ZOFRAN ODT) 8 MG disintegrating tablet Take 1 tablet (8 mg total) by mouth every 8 (eight) hours as needed for nausea or vomiting. 07/27/14   Tatyana  Kirichenko, PA-C  pantoprazole (PROTONIX) 20 MG tablet Take 2 tablets (40 mg total) by mouth daily. Patient not taking: Reported on 07/27/2014 10/09/13   Antonietta Breach, PA-C  promethazine (PHENERGAN) 25 MG suppository Place 1 suppository (25 mg total) rectally every 6 (six) hours as needed for nausea or vomiting. 07/27/14   Tatyana Kirichenko, PA-C  sucralfate (CARAFATE) 1 GM/10ML suspension Take 10 mLs (1 g total) by mouth 4 (four) times daily -  with meals and at bedtime. Patient not taking: Reported on 07/27/2014 10/09/13   Antonietta Breach, PA-C    Family History No family history on file.  Social History Social History  Substance Use Topics  . Smoking status: Current Some Day Smoker    Types: Cigarettes  . Smokeless tobacco: Never Used  . Alcohol use Yes     Allergies   Doxycycline   Review of Systems Review of Systems All other systems negative except as documented in the HPI. All pertinent positives and negatives as reviewed in the HPI.  Physical Exam Updated Vital Signs BP (!) 137/105 (BP Location: Left Arm)   Pulse (!) 54   Temp 98.1 F (36.7 C) (Oral)   Resp 20   Ht 5\' 8"  (1.727 m)   Wt (!) 163.3 kg   LMP 04/22/2016   SpO2 100%   BMI 54.74 kg/m   Physical Exam  Constitutional: She is oriented to person, place, and time. She appears well-developed  and well-nourished. No distress.  HENT:  Head: Normocephalic and atraumatic.  Mouth/Throat: Oropharynx is clear and moist.  Eyes: Pupils are equal, round, and reactive to light.  Neck: Normal range of motion. Neck supple.  Cardiovascular: Normal rate, regular rhythm and normal heart sounds.  Exam reveals no gallop and no friction rub.   No murmur heard. Pulmonary/Chest: Effort normal and breath sounds normal. No respiratory distress. She has no wheezes.  Abdominal: Soft. Bowel sounds are normal. She exhibits no distension and no mass. There is no tenderness. There is no rebound and no guarding. No hernia.  Neurological: She  is alert and oriented to person, place, and time. She exhibits normal muscle tone. Coordination normal.  Skin: Skin is warm and dry. Capillary refill takes less than 2 seconds. No rash noted. No erythema.  Psychiatric: She has a normal mood and affect. Her behavior is normal.  Nursing note and vitals reviewed.    ED Treatments / Results  Labs (all labs ordered are listed, but only abnormal results are displayed) Labs Reviewed  COMPREHENSIVE METABOLIC PANEL - Abnormal; Notable for the following:       Result Value   Glucose, Bld 141 (*)    All other components within normal limits  CBC - Abnormal; Notable for the following:    RBC 5.18 (*)    All other components within normal limits  LIPASE, BLOOD  URINALYSIS, ROUTINE W REFLEX MICROSCOPIC  I-STAT BETA HCG BLOOD, ED ([facility], [facility], [facility] ONLY)    EKG  EKG Interpretation None       Radiology No results found.  Procedures Procedures (including critical care time)  Medications Ordered in ED Medications  ondansetron (ZOFRAN-ODT) disintegrating tablet 4 mg (not administered)  ondansetron (ZOFRAN-ODT) 4 MG disintegrating tablet (not administered)  sodium chloride 0.9 % bolus 1,000 mL (not administered)  ondansetron (ZOFRAN-ODT) disintegrating tablet 4 mg (4 mg Oral Given 05/27/16 2233)  promethazine (PHENERGAN) injection 25 mg (25 mg Intravenous Given 05/27/16 2326)     Initial Impression / Assessment and Plan / ED Course  I have reviewed the triage vital signs and the nursing notes.  Pertinent labs & imaging results that were available during my care of the patient were reviewed by me and considered in my medical decision making (see chart for details).     The patient most likely has developed GI illness related to the food that she was eating at the restaurant.  The patient was given IV fluids and antiemetics here in the emergency department.  She has been able tolerate oral fluids.  Patient agrees the plan and all questions were  answered.  Patient does not appear septic and does not have any other signs of infection at this time.  The patient's abdominal discomfort was minimal.  Therefore, I do not feel she needs a CT scan or other imaging.  She is advised return for any worsening in her condition  Final Clinical Impressions(s) / ED Diagnoses   Final diagnoses:  None    New Prescriptions New Prescriptions   No medications on file     Dalia Heading, PA-C 05/28/16 [place], MD 05/28/16 [phone]

## 2016-05-28 NOTE — Discharge Instructions (Signed)
Return here as needed.  Follow-up with your primary care doctor.  Slowly increase your fluid intake.  You can start eating beans, rice bananas toast and applesauce after 12 hours and no vomiting

## 2016-06-23 ENCOUNTER — Emergency Department (HOSPITAL_COMMUNITY)
Admission: EM | Admit: 2016-06-23 | Discharge: 2016-06-23 | Disposition: A | Discharge status: Home / Self Care | Payer: 59 | Attending: Emergency Medicine | Admitting: Emergency Medicine

## 2016-06-23 DIAGNOSIS — R1013 Epigastric pain: Secondary | ICD-10-CM | POA: Diagnosis not present

## 2016-06-23 DIAGNOSIS — F10120 Alcohol abuse with intoxication, uncomplicated: Secondary | ICD-10-CM | POA: Insufficient documentation

## 2016-06-23 DIAGNOSIS — R112 Nausea with vomiting, unspecified: Secondary | ICD-10-CM | POA: Diagnosis present

## 2016-06-23 DIAGNOSIS — R109 Unspecified abdominal pain: Secondary | ICD-10-CM

## 2016-06-23 LAB — COMPREHENSIVE METABOLIC PANEL
ALBUMIN: 3.8 g/dL (ref 3.5–5.0)
ALT: 37 U/L (ref 14–54)
AST: 35 U/L (ref 15–41)
Alkaline Phosphatase: 65 U/L (ref 38–126)
Anion gap: 14 (ref 5–15)
BUN: 11 mg/dL (ref 6–20)
CHLORIDE: 105 mmol/L (ref 101–111)
CO2: 20 mmol/L — ABNORMAL LOW (ref 22–32)
CREATININE: 0.88 mg/dL (ref 0.44–1.00)
Calcium: 9.1 mg/dL (ref 8.9–10.3)
GFR calc Af Amer: >60 mL/min (ref >60)
GFR calc non Af Amer: >60 mL/min (ref >60)
Glucose, Bld: 132 mg/dL — ABNORMAL HIGH (ref 65–99)
Potassium: 3.4 mmol/L — ABNORMAL LOW (ref 3.5–5.1)
SODIUM: 139 mmol/L (ref 135–145)
Total Bilirubin: 0.9 mg/dL (ref 0.3–1.2)
Total Protein: 7.7 g/dL (ref 6.5–8.1)

## 2016-06-23 LAB — URINALYSIS, ROUTINE W REFLEX MICROSCOPIC
BACTERIA UA: NONE SEEN
Bilirubin Urine: NEGATIVE
Glucose, UA: NEGATIVE mg/dL
Ketones, ur: 20 mg/dL — AB
Leukocytes, UA: NEGATIVE
Nitrite: NEGATIVE
PH: 5 (ref 5.0–8.0)
PROTEIN: 30 mg/dL — AB
Specific Gravity, Urine: 1.031 — ABNORMAL HIGH (ref 1.005–1.030)

## 2016-06-23 LAB — CBC WITH DIFFERENTIAL/PLATELET
Basophils Absolute: 0 10*3/uL (ref 0.0–0.1)
Basophils Relative: 0 %
EOS PCT: 1 %
Eosinophils Absolute: 0.1 10*3/uL (ref 0.0–0.7)
HEMATOCRIT: 40.2 % (ref 36.0–46.0)
Hemoglobin: 14.1 g/dL (ref 12.0–15.0)
LYMPHS ABS: 3.6 10*3/uL (ref 0.7–4.0)
LYMPHS PCT: 37 %
MCH: 27.3 pg (ref 26.0–34.0)
MCHC: 35.1 g/dL (ref 30.0–36.0)
MCV: 77.9 fL — AB (ref 78.0–100.0)
MONO ABS: 0.9 10*3/uL (ref 0.1–1.0)
Monocytes Relative: 9 %
NEUTROS ABS: 5.2 10*3/uL (ref 1.7–7.7)
Neutrophils Relative %: 53 %
PLATELETS: 295 10*3/uL (ref 150–400)
RBC: 5.16 MIL/uL — AB (ref 3.87–5.11)
RDW: 13.3 % (ref 11.5–15.5)
WBC: 9.8 10*3/uL (ref 4.0–10.5)

## 2016-06-23 LAB — LIPASE, BLOOD: Lipase: 15 U/L (ref 11–51)

## 2016-06-23 LAB — PREGNANCY, URINE: PREG TEST UR: NEGATIVE

## 2016-06-23 MED ORDER — SUCRALFATE 1 GM/10ML PO SUSP: 1.0000 g | Freq: Three times a day (TID) | ORAL | 0 refills | Status: DC

## 2016-06-23 MED ORDER — GI COCKTAIL ~~LOC~~
30.0000 mL | Freq: Once | ORAL | Status: AC
  Administered 2016-06-23: 30 mL via ORAL
  Filled 2016-06-23: qty 30

## 2016-06-23 MED ORDER — PROMETHAZINE HCL 25 MG PO TABS: 25.0000 mg | ORAL_TABLET | Freq: Four times a day (QID) | ORAL | 0 refills | Status: DC | PRN

## 2016-06-23 MED ORDER — PROMETHAZINE HCL 25 MG/ML IJ SOLN
12.5000 mg | Freq: Once | INTRAMUSCULAR | Status: AC
  Administered 2016-06-23: 12.5 mg via INTRAVENOUS
  Filled 2016-06-23: qty 1

## 2016-06-23 MED ORDER — PANTOPRAZOLE SODIUM 40 MG PO TBEC: 40.0000 mg | DELAYED_RELEASE_TABLET | Freq: Every day | ORAL | 0 refills | Status: DC

## 2016-06-23 MED ORDER — ONDANSETRON HCL 4 MG/2ML IJ SOLN
4.0000 mg | Freq: Once | INTRAMUSCULAR | Status: AC
  Administered 2016-06-23: 4 mg via INTRAVENOUS
  Filled 2016-06-23: qty 2

## 2016-06-23 MED ORDER — MORPHINE SULFATE (PF) 4 MG/ML IV SOLN
4.0000 mg | Freq: Once | INTRAVENOUS | Status: AC
  Administered 2016-06-23: 4 mg via INTRAVENOUS
  Filled 2016-06-23: qty 1

## 2016-06-23 MED ORDER — SODIUM CHLORIDE 0.9 % IV BOLUS (SEPSIS)
1000.0000 mL | Freq: Once | INTRAVENOUS | Status: AC
  Administered 2016-06-23: 1000 mL via INTRAVENOUS

## 2016-06-23 MED ORDER — PROMETHAZINE HCL 25 MG RE SUPP: 25.0000 mg | Freq: Four times a day (QID) | RECTAL | 0 refills | Status: DC | PRN

## 2016-06-23 MED ORDER — ONDANSETRON HCL 4 MG/2ML IJ SOLN
4.0000 mg | Freq: Once | INTRAMUSCULAR | Status: DC
  Filled 2016-06-23: qty 2

## 2016-06-23 NOTE — ED Notes (Signed)
Pt gagging and again c/o nausea return.

## 2016-06-23 NOTE — ED Notes (Signed)
Will try to repeat EKG when pt stops vomiting.

## 2016-06-23 NOTE — ED Notes (Signed)
Pt given female urinal

## 2016-06-23 NOTE — ED Notes (Signed)
Pt oob to [facility] with assist. 

## 2016-06-23 NOTE — ED Triage Notes (Signed)
Patient comes in per EMS with n/v/d since Monday. Generalized abd pain. Denies dizziness. No IV access. No meds given in route. 1 wk ago had root canal and given oxy for it. Has not taken oxy last couple days. Ems v/s 160/90, 60 HR, 20 RR, and 96% RA.

## 2016-06-23 NOTE — Discharge Instructions (Signed)
Continue to stay hydrated. Take medications as prescribed; caution phenergan may cause sedation--do not drink alcohol, drive, or operate machinery while taking. Call to schedule a follow up appointment with your primary care doctor within 2 days. Return to ER if you experience fevers, unexplained weight loss, dizziness, vision or gait changes, chest pain, shortness of breath, change in/persistent/worsening abdominal pain, unable to tolerate food or fluids, blood in urine or stool, worsening symptoms, or any additional concerns.

## 2016-06-23 NOTE — ED Notes (Signed)
Pt and family states they are waiting for provider for information. PA aware.

## 2016-06-23 NOTE — ED Provider Notes (Signed)
[facility] DEPT Provider Note   CSN: [id number] Arrival date & time: 06/23/16  9470     History   Chief Complaint Chief Complaint  Patient presents with  . Emesis  . Abdominal Pain    HPI Miranda Stewart is a 31 y.o. female.  Pt is a 31 y/o F with PMH of migraine who presents to ED for epigastric abdominal pain, onset 2 days ago, describes as cramping, no radiation, with associated nausea, vomiting, and diarrhea x yesterday. Denies recent travel or known sick contacts. Denies recent antibiotic, NSAID, or alcohol use. Reports hx of stomach ulcers in the past. Denies fever, chills, unexplained weight loss, dizziness, vision or gait changes, CP, SOB, hemoptysis, hematemesis, BRBPR, melena, dysuria, hematuria, or any additional concerns. No anticoag use.      Past Medical History:  Diagnosis Date  . Migraines   . Obesity   . Seasonal allergies     There are no active problems to display for this patient.   Past Surgical History:  Procedure Laterality Date  . ADENOIDECTOMY    . TONSILLECTOMY      OB History    No data available       Home Medications    Prior to Admission medications   Medication Sig Start Date End Date Taking? Authorizing Provider  naproxen (NAPROSYN) 500 MG tablet Take 1 tablet (500 mg total) by mouth 2 (two) times daily with a meal. Patient not taking: Reported on 05/28/2016 09/15/15   Lysbeth Penner, FNP  ondansetron (ZOFRAN ODT) 4 MG disintegrating tablet 4mg  ODT q4 hours prn nausea/vomit Patient not taking: Reported on 06/23/2016 05/28/16   Orpah Greek, MD  pantoprazole (PROTONIX) 20 MG tablet Take 2 tablets (40 mg total) by mouth daily. Patient not taking: Reported on 07/27/2014 10/09/13   Antonietta Breach, PA-C  pantoprazole (PROTONIX) 40 MG tablet Take 1 tablet (40 mg total) by mouth daily. 06/23/16   Bernadene Bell Mattisen Pohlmann, NP  promethazine (PHENERGAN) 25 MG suppository Place 1 suppository (25 mg total) rectally every 6 (six) hours as needed for  nausea or vomiting. Patient not taking: Reported on 06/23/2016 05/28/16   Orpah Greek, MD  promethazine (PHENERGAN) 25 MG tablet Take 1 tablet (25 mg total) by mouth every 8 (eight) hours as needed for nausea or vomiting. Patient not taking: Reported on 06/23/2016 05/28/16   Dalia Heading, PA-C  promethazine (PHENERGAN) 25 MG tablet Take 1 tablet (25 mg total) by mouth every 6 (six) hours as needed for nausea or vomiting. 06/23/16   Bernadene Bell Avalie Oconnor, NP  sucralfate (CARAFATE) 1 GM/10ML suspension Take 10 mLs (1 g total) by mouth 4 (four) times daily -  with meals and at bedtime. 06/23/16   Ulice Bold, NP    Family History No family history on file.  Social History Social History  Substance Use Topics  . Smoking status: Current Some Day Smoker    Types: Cigarettes  . Smokeless tobacco: Never Used  . Alcohol use Yes     Allergies   Doxycycline   Review of Systems Review of Systems  Constitutional: Negative for chills, fever and unexpected weight change.  HENT: Negative for congestion, ear pain and sore throat.   Eyes: Negative for visual disturbance.  Respiratory: Negative for cough and shortness of breath.   Cardiovascular: Negative for chest pain.  Gastrointestinal: Positive for abdominal pain, diarrhea, nausea and vomiting.  Genitourinary: Negative for dysuria, flank pain and hematuria.  Musculoskeletal: Negative for back pain, neck  pain and neck stiffness.  Skin: Negative for rash.  Neurological: Negative for dizziness, syncope, weakness and headaches.     Physical Exam Updated Vital Signs BP (!) 149/95   Pulse (!) 48   Temp 98.4 F (36.9 C) (Oral)   Resp 18   Ht 5\' 8"  (1.727 m)   Wt (!) 163.3 kg   SpO2 99%   BMI 54.74 kg/m   Physical Exam  Constitutional: She is oriented to person, place, and time. She appears well-developed and well-nourished.  HENT:  Head: Normocephalic and atraumatic.  Right Ear: Tympanic membrane and ear canal normal.  Left Ear:  Tympanic membrane and ear canal normal.  Nose: Nose normal.  Mouth/Throat: Uvula is midline, oropharynx is clear and moist and mucous membranes are normal.  Eyes: Conjunctivae and EOM are normal. Pupils are equal, round, and reactive to light.  Neck: Normal range of motion. Neck supple.  Cardiovascular: Normal rate, regular rhythm, normal heart sounds and intact distal pulses.   No murmur heard. Pulmonary/Chest: Effort normal and breath sounds normal. She has no wheezes. She has no rales.  Abdominal: Soft. Bowel sounds are normal. There is tenderness (epigastric mildly ttp). There is no rebound, no guarding and negative Murphy's sign.  Musculoskeletal: Normal range of motion.  Neurological: She is alert and oriented to person, place, and time.  Strength 5/5 to bilateral UE and LE. Finger to nose intact. Speech clear, no facial symmetry.   Skin: Skin is warm and dry.  Psychiatric: She has a normal mood and affect. Her behavior is normal.  Nursing note and vitals reviewed.    ED Treatments / Results  Labs (all labs ordered are listed, but only abnormal results are displayed) Labs Reviewed  COMPREHENSIVE METABOLIC PANEL - Abnormal; Notable for the following:       Result Value   Potassium 3.4 (*)    CO2 20 (*)    Glucose, Bld 132 (*)    All other components within normal limits  CBC WITH DIFFERENTIAL/PLATELET - Abnormal; Notable for the following:    RBC 5.16 (*)    MCV 77.9 (*)    All other components within normal limits  URINALYSIS, ROUTINE W REFLEX MICROSCOPIC - Abnormal; Notable for the following:    Color, Urine AMBER (*)    APPearance CLOUDY (*)    Specific Gravity, Urine 1.031 (*)    Hgb urine dipstick SMALL (*)    Ketones, ur 20 (*)    Protein, ur 30 (*)    Squamous Epithelial / LPF 6-30 (*)    All other components within normal limits  LIPASE, BLOOD  PREGNANCY, URINE    EKG  EKG Interpretation  Date/Time:  Wednesday Jun 23 2016 11:19:48 EDT Ventricular Rate:    47 PR Interval:    QRS Duration: 95 QT Interval:  519 QTC Calculation: 459 R Axis:   64 Text Interpretation:  Sinus bradycardia Short PR interval Abnormal Q suggests anterior infarct ST elev, probable normal early repol pattern Confirmed by GOLDSTON MD, SCOTT [phone]) on 06/23/2016 12:55:01 PM       Radiology No results found.  Procedures Procedures (including critical care time)  Medications Ordered in ED Medications  sodium chloride 0.9 % bolus 1,000 mL (1,000 mLs Intravenous New Bag/Given 06/23/16 0946)  ondansetron (ZOFRAN) injection 4 mg (4 mg Intravenous Given 06/23/16 0945)  morphine 4 MG/ML injection 4 mg (4 mg Intravenous Given 06/23/16 0946)  gi cocktail (Maalox,Lidocaine,Donnatal) (30 mLs Oral Given 06/23/16 1129)  promethazine (PHENERGAN) injection  12.5 mg (12.5 mg Intravenous Given 06/23/16 1313)     Initial Impression / Assessment and Plan / ED Course  I have reviewed the triage vital signs and the nursing notes.  Pertinent labs & imaging results that were available during my care of the patient were reviewed by me and considered in my medical decision making (see chart for details).    Pt is a 31 y/o F who presents to ED for epigastric abd pain and n/v/d, concern for gastritis. Will get labs for electrolyte abnormality, lipase for pancreatitis. Plan to give antiemetic and IVF; re-eval.   10:19 AM K 3.4, lipase normal, WBC normal. Pending UA. Pt updated on results, IVF infusing.  10:59 AM Pt reports relief in nausea, mild epigastric discomfort; will give GI cocktail.  11:36 PM Pt tolerated Gi cocktail and reported relief in symptoms. Aware of pending urine.   12:43 PM On re-eval, reports nausea again. Will order additional dose of anti-emetric. Likely dc if tolerates PO chall.  1:44 PM Patient is now tolerating fluids by mouth without emesis. Abdominal reexamination improved from prior. Will plan to discharge with protonix, carafate, and phenergan; pt amenable to  this plan. Discussed results, discharge instructions, rx and safety, return precautions, and follow up. Pt verbalizes understanding using verbal teachback and agrees with plan, denies any additional concerns.   Final Clinical Impressions(s) / ED Diagnoses   Final diagnoses:  Abdominal pain, unspecified abdominal location  Nausea and vomiting, intractability of vomiting not specified, unspecified vomiting type    New Prescriptions New Prescriptions   PANTOPRAZOLE (PROTONIX) 40 MG TABLET    Take 1 tablet (40 mg total) by mouth daily.   PROMETHAZINE (PHENERGAN) 25 MG TABLET    Take 1 tablet (25 mg total) by mouth every 6 (six) hours as needed for nausea or vomiting.   SUCRALFATE (CARAFATE) 1 GM/10ML SUSPENSION    Take 10 mLs (1 g total) by mouth 4 (four) times daily -  with meals and at bedtime.     Ulice Bold, NP 06/25/16 4665    Sherwood Gambler, MD 06/29/16 [phone]

## 2016-06-23 NOTE — ED Notes (Signed)
Iv site confirmed by retrn of blood and easily flows to gravity. Pt urged to keep arm straight.

## 2016-10-08 ENCOUNTER — Encounter (HOSPITAL_COMMUNITY): Payer: Self-pay | Admitting: *Deleted

## 2016-10-08 ENCOUNTER — Emergency Department (HOSPITAL_COMMUNITY)
Admission: EM | Admit: 2016-10-08 | Discharge: 2016-10-08 | Discharge status: Against Medical Advice | Payer: 59 | Attending: Emergency Medicine | Admitting: Emergency Medicine

## 2016-10-08 DIAGNOSIS — L0231 Cutaneous abscess of buttock: Secondary | ICD-10-CM | POA: Insufficient documentation

## 2016-10-08 HISTORY — DX: Polycystic ovarian syndrome: E28.2

## 2016-10-08 NOTE — ED Triage Notes (Signed)
Pt has abscess to mid top bottom and been there for one week.  It is not draining

## 2016-10-08 NOTE — ED Notes (Signed)
Pt updated on wait time, pt encouraged to wait to be evaluated by MD, pt offered a Percocet for pain management, pt walked away from RN, pt states, "Take me out of the system. I am leaving."

## 2016-11-14 ENCOUNTER — Encounter (HOSPITAL_COMMUNITY): Payer: Self-pay | Admitting: Emergency Medicine

## 2016-11-14 ENCOUNTER — Ambulatory Visit (HOSPITAL_COMMUNITY)
Admission: EM | Admit: 2016-11-14 | Discharge: 2016-11-14 | Disposition: A | Discharge status: Home / Self Care | Payer: 59 | Attending: Family Medicine | Admitting: Family Medicine

## 2016-11-14 DIAGNOSIS — M19041 Primary osteoarthritis, right hand: Secondary | ICD-10-CM

## 2016-11-14 DIAGNOSIS — J40 Bronchitis, not specified as acute or chronic: Secondary | ICD-10-CM

## 2016-11-14 MED ORDER — MONTELUKAST SODIUM 10 MG PO TABS: 10.0000 mg | ORAL_TABLET | Freq: Every day | ORAL | 2 refills | Status: DC

## 2016-11-14 MED ORDER — ALBUTEROL SULFATE HFA 108 (90 BASE) MCG/ACT IN AERS: 2.0000 | INHALATION_SPRAY | RESPIRATORY_TRACT | 11 refills | Status: DC | PRN

## 2016-11-14 MED ORDER — PREDNISONE 20 MG PO TABS: ORAL_TABLET | ORAL | 0 refills | Status: DC

## 2016-11-14 NOTE — Discharge Instructions (Signed)
Please discussed the possibility of asthmatic bronchitis with your primary care provider.

## 2016-11-14 NOTE — ED Triage Notes (Signed)
Complains of cough, wheeze and post nasal drip -symptoms for 2 weeks.  History of bronchitis  Right index finger pain.  Finger has a burning sensation.  Burning and swelling in finger for a week

## 2016-11-14 NOTE — ED Provider Notes (Signed)
[facility]   [id number] 11/14/16 Arrival Time: 1208   SUBJECTIVE:  Miranda Stewart is a 31 y.o. female who presents to the urgent care with complaint of cough, wheeze and post nasal drip -symptoms for 2 weeks.  History of bronchitis, usually fall and spring.  Using boyfriend's inhaler with some relief.  Right index finger pain.  Finger has a burning sensation.  Burning and swelling in finger for a week Works in Therapist, art at call center. No fever    Past Medical History:  Diagnosis Date  . Migraines   . Obesity   . Polycystic ovarian disease   . Seasonal allergies    No family history on file. Social History   Social History  . Marital status: Single    Spouse name: N/A  . Number of children: N/A  . Years of education: N/A   Occupational History  . Not on file.   Social History Main Topics  . Smoking status: Current Some Day Smoker    Types: Cigarettes  . Smokeless tobacco: Never Used  . Alcohol use Yes  . Drug use: No  . Sexual activity: Yes    Birth control/ protection: None   Other Topics Concern  . Not on file   Social History Narrative  . No narrative on file   Current Meds  Medication Sig  . metFORMIN (GLUCOPHAGE) 1000 MG tablet Take 1,000 mg by mouth 2 (two) times daily with a meal.  . [DISCONTINUED] pantoprazole (PROTONIX) 40 MG tablet Take 1 tablet (40 mg total) by mouth daily.   Allergies  Allergen Reactions  . Doxycycline Nausea And Vomiting    (patient notes that only the tablets have caused this)      ROS: As per HPI, remainder of ROS negative.   OBJECTIVE:   Vitals:   11/14/16 1251  BP: (!) 144/92  Pulse: 84  Resp: (!) 24  Temp: 98.4 F (36.9 C)  TempSrc: Oral  SpO2: 100%     General appearance: alert; no distress Eyes: PERRL; EOMI; conjunctiva normal HENT: normocephalic; atraumatic; TMs normal, canal normal, external ears normal without trauma; nasal mucosa normal; oral mucosa normal Neck:  supple Lungs: ronch to auscultation bilaterally Heart: regular rate and rhythm Abdomen: soft, non-tender; bowel sounds normal; no masses or organomegaly; no guarding or rebound tenderness Back: no CVA tenderness Extremities: no cyanosis or edema; symmetrical with slight swelling DIP of right index finger Skin: warm and dry Neurologic: normal gait; grossly normal Psychological: alert and cooperative; normal mood and affect      Labs:  Results for orders placed or performed during the hospital encounter of 06/23/16  Comprehensive metabolic panel  Result Value Ref Range   Sodium 139 135 - 145 mmol/L   Potassium 3.4 (L) 3.5 - 5.1 mmol/L   Chloride 105 101 - 111 mmol/L   CO2 20 (L) 22 - 32 mmol/L   Glucose, Bld 132 (H) 65 - 99 mg/dL   BUN 11 6 - 20 mg/dL   Creatinine, Ser 0.88 0.44 - 1.00 mg/dL   Calcium 9.1 8.9 - 10.3 mg/dL   Total Protein 7.7 6.5 - 8.1 g/dL   Albumin 3.8 3.5 - 5.0 g/dL   AST 35 15 - 41 U/L   ALT 37 14 - 54 U/L   Alkaline Phosphatase 65 38 - 126 U/L   Total Bilirubin 0.9 0.3 - 1.2 mg/dL   GFR calc non Af Amer >60 >60 mL/min   GFR calc Af Amer >60 >60  mL/min   Anion gap 14 5 - 15  Lipase, blood  Result Value Ref Range   Lipase 15 11 - 51 U/L  CBC with Differential  Result Value Ref Range   WBC 9.8 4.0 - 10.5 K/uL   RBC 5.16 (H) 3.87 - 5.11 MIL/uL   Hemoglobin 14.1 12.0 - 15.0 g/dL   HCT 40.2 36.0 - 46.0 %   MCV 77.9 (L) 78.0 - 100.0 fL   MCH 27.3 26.0 - 34.0 pg   MCHC 35.1 30.0 - 36.0 g/dL   RDW 13.3 11.5 - 15.5 %   Platelets 295 150 - 400 K/uL   Neutrophils Relative % 53 %   Neutro Abs 5.2 1.7 - 7.7 K/uL   Lymphocytes Relative 37 %   Lymphs Abs 3.6 0.7 - 4.0 K/uL   Monocytes Relative 9 %   Monocytes Absolute 0.9 0.1 - 1.0 K/uL   Eosinophils Relative 1 %   Eosinophils Absolute 0.1 0.0 - 0.7 K/uL   Basophils Relative 0 %   Basophils Absolute 0.0 0.0 - 0.1 K/uL  Urinalysis, Routine w reflex microscopic  Result Value Ref Range   Color, Urine  AMBER (A) YELLOW   APPearance CLOUDY (A) CLEAR   Specific Gravity, Urine 1.031 (H) 1.005 - 1.030   pH 5.0 5.0 - 8.0   Glucose, UA NEGATIVE NEGATIVE mg/dL   Hgb urine dipstick SMALL (A) NEGATIVE   Bilirubin Urine NEGATIVE NEGATIVE   Ketones, ur 20 (A) NEGATIVE mg/dL   Protein, ur 30 (A) NEGATIVE mg/dL   Nitrite NEGATIVE NEGATIVE   Leukocytes, UA NEGATIVE NEGATIVE   RBC / HPF 6-30 0 - 5 RBC/hpf   WBC, UA 6-30 0 - 5 WBC/hpf   Bacteria, UA NONE SEEN NONE SEEN   Squamous Epithelial / LPF 6-30 (A) NONE SEEN   Mucus PRESENT   Pregnancy, urine  Result Value Ref Range   Preg Test, Ur NEGATIVE NEGATIVE    Labs Reviewed - No data to display  No results found.     ASSESSMENT & PLAN:  1. Bronchitis   2. OA (osteoarthritis) of finger, right     Meds ordered this encounter  Medications  . metFORMIN (GLUCOPHAGE) 1000 MG tablet    Sig: Take 1,000 mg by mouth 2 (two) times daily with a meal.  . montelukast (SINGULAIR) 10 MG tablet    Sig: Take 1 tablet (10 mg total) by mouth at bedtime.    Dispense:  30 tablet    Refill:  2  . albuterol (PROVENTIL HFA;VENTOLIN HFA) 108 (90 Base) MCG/ACT inhaler    Sig: Inhale 2 puffs into the lungs every 4 (four) hours as needed for wheezing or shortness of breath (cough, shortness of breath or wheezing.).    Dispense:  1 Inhaler    Refill:  11  . predniSONE (DELTASONE) 20 MG tablet    Sig: Two daily with food    Dispense:  4 tablet    Refill:  0    Reviewed expectations re: course of current medical issues. Questions answered. Outlined signs and symptoms indicating need for more acute intervention. Patient verbalized understanding. After Visit Summary given.    Procedures:      Robyn Haber, MD 11/14/16 1259

## 2017-07-20 LAB — OB RESULTS CONSOLE HIV ANTIBODY (ROUTINE TESTING): HIV: NONREACTIVE

## 2017-07-20 LAB — OB RESULTS CONSOLE RUBELLA ANTIBODY, IGM: RUBELLA: IMMUNE

## 2017-07-20 LAB — OB RESULTS CONSOLE HEPATITIS B SURFACE ANTIGEN: HEP B S AG: NEGATIVE

## 2017-09-21 ENCOUNTER — Inpatient Hospital Stay (HOSPITAL_BASED_OUTPATIENT_CLINIC_OR_DEPARTMENT_OTHER): Payer: 59

## 2017-09-21 ENCOUNTER — Inpatient Hospital Stay (HOSPITAL_COMMUNITY)
Admission: AD | Admit: 2017-09-21 | Discharge: 2017-09-21 | Disposition: A | Discharge status: Home / Self Care | Payer: 59 | Source: Ambulatory Visit | Attending: Obstetrics and Gynecology | Admitting: Obstetrics and Gynecology

## 2017-09-21 ENCOUNTER — Encounter (HOSPITAL_COMMUNITY): Payer: Self-pay | Admitting: *Deleted

## 2017-09-21 ENCOUNTER — Other Ambulatory Visit: Payer: Self-pay

## 2017-09-21 DIAGNOSIS — O26899 Other specified pregnancy related conditions, unspecified trimester: Secondary | ICD-10-CM

## 2017-09-21 DIAGNOSIS — Z3A18 18 weeks gestation of pregnancy: Secondary | ICD-10-CM | POA: Diagnosis not present

## 2017-09-21 DIAGNOSIS — O30042 Twin pregnancy, dichorionic/diamniotic, second trimester: Secondary | ICD-10-CM

## 2017-09-21 DIAGNOSIS — O210 Mild hyperemesis gravidarum: Secondary | ICD-10-CM | POA: Diagnosis present

## 2017-09-21 DIAGNOSIS — O3680X0 Pregnancy with inconclusive fetal viability, not applicable or unspecified: Secondary | ICD-10-CM | POA: Diagnosis not present

## 2017-09-21 DIAGNOSIS — O219 Vomiting of pregnancy, unspecified: Secondary | ICD-10-CM | POA: Diagnosis not present

## 2017-09-21 DIAGNOSIS — O26893 Other specified pregnancy related conditions, third trimester: Secondary | ICD-10-CM | POA: Diagnosis not present

## 2017-09-21 DIAGNOSIS — Z87891 Personal history of nicotine dependence: Secondary | ICD-10-CM | POA: Diagnosis not present

## 2017-09-21 DIAGNOSIS — E860 Dehydration: Secondary | ICD-10-CM

## 2017-09-21 DIAGNOSIS — O211 Hyperemesis gravidarum with metabolic disturbance: Secondary | ICD-10-CM | POA: Insufficient documentation

## 2017-09-21 DIAGNOSIS — R112 Nausea with vomiting, unspecified: Secondary | ICD-10-CM

## 2017-09-21 DIAGNOSIS — IMO0002 Reserved for concepts with insufficient information to code with codable children: Secondary | ICD-10-CM

## 2017-09-21 HISTORY — DX: Major depressive disorder, single episode, unspecified: F32.9

## 2017-09-21 HISTORY — DX: Anxiety disorder, unspecified: F41.9

## 2017-09-21 HISTORY — DX: Depression, unspecified: F32.A

## 2017-09-21 HISTORY — DX: Unspecified abnormal cytological findings in specimens from vagina: R87.629

## 2017-09-21 LAB — URINALYSIS, ROUTINE W REFLEX MICROSCOPIC
Bilirubin Urine: NEGATIVE
GLUCOSE, UA: NEGATIVE mg/dL
Hgb urine dipstick: NEGATIVE
Ketones, ur: 80 mg/dL — AB
Leukocytes, UA: NEGATIVE
Nitrite: NEGATIVE
PH: 5 (ref 5.0–8.0)
Protein, ur: 100 mg/dL — AB
SPECIFIC GRAVITY, URINE: 1.039 — AB (ref 1.005–1.030)
Squamous Epithelial / LPF: >50 — ABNORMAL HIGH (ref 0–5)

## 2017-09-21 MED ORDER — ONDANSETRON HCL 4 MG/2ML IJ SOLN
4.0000 mg | Freq: Once | INTRAMUSCULAR | Status: AC
  Administered 2017-09-21: 4 mg via INTRAVENOUS
  Filled 2017-09-21: qty 2

## 2017-09-21 MED ORDER — LACTATED RINGERS IV BOLUS
1000.0000 mL | Freq: Once | INTRAVENOUS | Status: AC
  Administered 2017-09-21: 1000 mL via INTRAVENOUS

## 2017-09-21 NOTE — MAU Provider Note (Addendum)
History  CSN: [id number] Arrival date and time: 09/21/17 1439  None     Chief Complaint  Patient presents with  . Emesis    HPI: TRIVIA HEFFELFINGER is a 32 y.o. G1P0 with twin pregnancy at 108w2d who presents to maternity admissions reporting emesis. Has had several episodes of emesis over the past 24 hours. Has tried drinking fluids, eating watermelon, other fruits but has continued to vomit. Was seen by OB today and sent to MAU for dehydration. Reportedly had ketones on urine at office. Has been taking Zofran PO at home, last took at 9 am this morning. Reports Phenergan has not worked well for her in the past.   Denies contractions, leakage of fluid or vaginal bleeding.  Also denies any abnormal vaginal discharge, vaginal bleeding, fevers, chills, malaise, dysuria, hematuria, urinary frequency, diarrhea, RUQ/epigastric pain, dizziness/lighreadhess, or headache.   She receives [facility]. Pregnancy complicated by ?cHTN (patient reports borderline BPs, never been on medications).     OB History  Gravida Para Term Preterm AB Living  1            SAB TAB Ectopic Multiple Live Births               # Outcome Date GA Lbr Len/2nd Weight Sex Delivery Anes PTL Lv  1 Current            Past Medical History:  Diagnosis Date  . Anxiety   . Depression   . Migraines   . Obesity   . Polycystic ovarian disease   . Seasonal allergies   . Vaginal Pap smear, abnormal    Past Surgical History:  Procedure Laterality Date  . ADENOIDECTOMY    . TONSILLECTOMY     No family history on file. Social History   Socioeconomic History  . Marital status: Single    Spouse name: Not on file  . Number of children: Not on file  . Years of education: Not on file  . Highest education level: Not on file  Occupational History  . Not on file  Social Needs  . Financial resource strain: Not on file  . Food insecurity:    Worry: Not on file    Inability: Not on file  . Transportation  needs:    Medical: Not on file    Non-medical: Not on file  Tobacco Use  . Smoking status: Former Smoker    Types: Cigarettes  . Smokeless tobacco: Never Used  . Tobacco comment: stopped with pregnancy  Substance and Sexual Activity  . Alcohol use: Yes  . Drug use: No  . Sexual activity: Yes    Birth control/protection: None  Lifestyle  . Physical activity:    Days per week: Not on file    Minutes per session: Not on file  . Stress: Not on file  Relationships  . Social connections:    Talks on phone: Not on file    Gets together: Not on file    Attends religious service: Not on file    Active member of club or organization: Not on file    Attends meetings of clubs or organizations: Not on file    Relationship status: Not on file  . Intimate partner violence:    Fear of current or ex partner: Not on file    Emotionally abused: Not on file    Physically abused: Not on file    Forced sexual activity: Not on file  Other Topics Concern  .  Not on file  Social History Narrative  . Not on file   Allergies  Allergen Reactions  . Doxycycline Nausea And Vomiting    (patient notes that only the tablets have caused this)    Medications Prior to Admission  Medication Sig Dispense Refill Last Dose  . albuterol (PROVENTIL HFA;VENTOLIN HFA) 108 (90 Base) MCG/ACT inhaler Inhale 2 puffs into the lungs every 4 (four) hours as needed for wheezing or shortness of breath (cough, shortness of breath or wheezing.). 1 Inhaler 11   . metFORMIN (GLUCOPHAGE) 1000 MG tablet Take 1,000 mg by mouth 2 (two) times daily with a meal.   11/14/2016 at Unknown time  . montelukast (SINGULAIR) 10 MG tablet Take 1 tablet (10 mg total) by mouth at bedtime. 30 tablet 2   . predniSONE (DELTASONE) 20 MG tablet Two daily with food 4 tablet 0   . sucralfate (CARAFATE) 1 GM/10ML suspension Take 10 mLs (1 g total) by mouth 4 (four) times daily -  with meals and at bedtime. 420 mL 0     I have reviewed patient's  Past Medical Hx, Surgical Hx, Family Hx, Social Hx, medications and allergies.   Review of Systems: Negative except for what is mentioned in HPI.  Physical Exam   Blood pressure (!) 148/89, pulse 81, temperature 98.6 F (37 C), temperature source Oral, resp. rate (!) 22, weight (!) 350 lb 4 oz (158.9 kg), last menstrual period 10/20/2016, SpO2 100 %.  Constitutional: Well-developed, well-nourished female, appears ill, vomiting at the bedside  HENT: [place]/AT, dry MM and lips.  Eyes: normal conjunctivae, no scleral icterus Cardiovascular: normal rate, regular rhythm Respiratory: normal effort, lungs CTAB.  GI: Abd soft, non-tender, gravid appropriate for gestational age.   GU: Neg CVAT. MSK: Extremities nontender, no edema Neurologic: Alert and oriented x 4. Psych: Normal mood and affect Skin: warm and dry    MAU Course/MDM:   Nursing notes and VS reviewed. Patient seen and examined, as noted above.    Results reviewed:  Results for orders placed or performed during the hospital encounter of 09/21/17  Urinalysis, Routine w reflex microscopic  Result Value Ref Range   Color, Urine AMBER (A) YELLOW   APPearance CLOUDY (A) CLEAR   Specific Gravity, Urine 1.039 (H) 1.005 - 1.030   pH 5.0 5.0 - 8.0   Glucose, UA NEGATIVE NEGATIVE mg/dL   Hgb urine dipstick NEGATIVE NEGATIVE   Bilirubin Urine NEGATIVE NEGATIVE   Ketones, ur 80 (A) NEGATIVE mg/dL   Protein, ur 100 (A) NEGATIVE mg/dL   Nitrite NEGATIVE NEGATIVE   Leukocytes, UA NEGATIVE NEGATIVE   RBC / HPF 0-5 0 - 5 RBC/hpf   WBC, UA 0-5 0 - 5 WBC/hpf   Bacteria, UA FEW (A) NONE SEEN   Squamous Epithelial / LPF >50 (H) 0 - 5   Mucus PRESENT     Patient appears clinically dehydrated. Will give LR 1L bolus and IV Zofran. UA at MAU consistent with dehydration with elevated spec gravity and 80 ketones.    Unable to find two heart beats with doppler. Bedside ultrasound performed and able to visualize one fetus with appropriate HR.  Unable to visualize second fetus, perhaps due to body habitus or ovarian teratoma. Limited sono ordered. Ultrasound was able to identify both fetuses and observed appropriate fetal heart rates for both.   After IVFs and anti-emetics, patient feeling much better. Sitting up , conversant, no further emesis. Able to tolerate PO intake of fluids, ice and saltine crackers.  Called to discuss with Dr. Harrington Challenger who passed along that patient has not had bowel movement in two days. This is likely contributing to her presentation. Patient no longer taking any type of bowel regimen.   Assessment and Plan  Assessment: 1. Dehydration during pregnancy   2. Fetal heart tones not heard   3. Non-intractable vomiting with nausea, unspecified vomiting type     Plan: -- Discussed appropriate diet at home for N/V. Recommended alternating Zofran and Phenergan prn.  --Discussed appropriate bowel regimen.  --BP noted to be elevated but not in severe range, needs follow up with OB. May benefit from anti-hypertensive agent.  --Strict return precautions discussed.  --Discharge home in stable condition.      Nicolette Bang, DO 09/21/2017 5:55 PM

## 2017-09-21 NOTE — MAU Note (Signed)
Ongoing vomiting.  This episode started yesterday at 1000, unable to eat or drink.  Been taking zofran.  Twin pregnancy.  Sent from office for IV fluids, "+ketones, dehydrated"

## 2017-09-21 NOTE — Progress Notes (Signed)
Unable to obtain simultaneous [facility]

## 2017-09-21 NOTE — Discharge Instructions (Signed)
You can alternate Zofran and Phenergan at home. Please return if you have vomiting despite medications, are unable to tolerate eating or drinking anything by mouth. Constipation is likely worsening your symptoms. I would recommend taking Miralax every day as well as Colace twice per day until you start having bowel movements daily.   Nausea & Vomiting  Have saltine crackers or pretzels by your bed and eat a few bites before you raise your head out of bed in the morning  Eat small frequent meals throughout the day instead of large meals  Drink plenty of fluids throughout the day to stay hydrated, just don't drink a lot of fluids with your meals.  This can make your stomach fill up faster making you feel sick  Do not brush your teeth right after you eat  Products with real ginger are good for nausea, like ginger ale and ginger hard candy Make sure it says made with real ginger!  Sucking on sour candy like lemon heads is also good for nausea  If your prenatal vitamins make you nauseated, take them at night so you will sleep through the nausea  Sea Bands  If you feel like you need medicine for the nausea & vomiting please let us know  If you are unable to keep any fluids or food down please let us know

## 2017-09-23 ENCOUNTER — Other Ambulatory Visit (HOSPITAL_COMMUNITY): Payer: Self-pay | Admitting: Obstetrics and Gynecology

## 2017-09-23 DIAGNOSIS — Z3A21 21 weeks gestation of pregnancy: Secondary | ICD-10-CM

## 2017-09-23 DIAGNOSIS — Z3689 Encounter for other specified antenatal screening: Secondary | ICD-10-CM

## 2017-09-29 ENCOUNTER — Other Ambulatory Visit (HOSPITAL_COMMUNITY): Payer: Self-pay | Admitting: Obstetrics and Gynecology

## 2017-09-29 DIAGNOSIS — Z3689 Encounter for other specified antenatal screening: Secondary | ICD-10-CM

## 2017-09-29 DIAGNOSIS — Z3A21 21 weeks gestation of pregnancy: Secondary | ICD-10-CM

## 2017-10-06 ENCOUNTER — Encounter (HOSPITAL_COMMUNITY): Payer: Self-pay

## 2017-10-13 ENCOUNTER — Other Ambulatory Visit (HOSPITAL_COMMUNITY): Payer: Self-pay | Admitting: *Deleted

## 2017-10-13 ENCOUNTER — Encounter (HOSPITAL_COMMUNITY): Payer: Self-pay

## 2017-10-13 ENCOUNTER — Ambulatory Visit (HOSPITAL_COMMUNITY)
Admission: RE | Admit: 2017-10-13 | Discharge: 2017-10-13 | Disposition: A | Discharge status: Home / Self Care | Payer: 59 | Source: Ambulatory Visit | Attending: Obstetrics and Gynecology | Admitting: Obstetrics and Gynecology

## 2017-10-13 DIAGNOSIS — O3680X0 Pregnancy with inconclusive fetal viability, not applicable or unspecified: Secondary | ICD-10-CM | POA: Diagnosis not present

## 2017-10-13 DIAGNOSIS — Z363 Encounter for antenatal screening for malformations: Secondary | ICD-10-CM | POA: Diagnosis not present

## 2017-10-13 DIAGNOSIS — O9989 Other specified diseases and conditions complicating pregnancy, childbirth and the puerperium: Secondary | ICD-10-CM | POA: Insufficient documentation

## 2017-10-13 DIAGNOSIS — O0382 Renal failure following complete or unspecified spontaneous abortion: Secondary | ICD-10-CM

## 2017-10-13 DIAGNOSIS — O30042 Twin pregnancy, dichorionic/diamniotic, second trimester: Secondary | ICD-10-CM | POA: Diagnosis not present

## 2017-10-13 DIAGNOSIS — D279 Benign neoplasm of unspecified ovary: Secondary | ICD-10-CM | POA: Diagnosis not present

## 2017-10-13 DIAGNOSIS — Z3689 Encounter for other specified antenatal screening: Secondary | ICD-10-CM

## 2017-10-13 DIAGNOSIS — Z3A21 21 weeks gestation of pregnancy: Secondary | ICD-10-CM | POA: Diagnosis not present

## 2017-10-13 HISTORY — DX: Benign neoplasm, unspecified site: D36.9

## 2017-11-03 LAB — OB RESULTS CONSOLE RPR: RPR: NONREACTIVE

## 2017-11-10 ENCOUNTER — Ambulatory Visit (HOSPITAL_COMMUNITY)
Admission: RE | Admit: 2017-11-10 | Discharge: 2017-11-10 | Disposition: A | Discharge status: Home / Self Care | Payer: 59 | Source: Ambulatory Visit | Attending: Obstetrics and Gynecology | Admitting: Obstetrics and Gynecology

## 2017-11-10 ENCOUNTER — Encounter (HOSPITAL_COMMUNITY): Payer: Self-pay

## 2017-11-10 DIAGNOSIS — O30042 Twin pregnancy, dichorionic/diamniotic, second trimester: Secondary | ICD-10-CM

## 2017-11-10 DIAGNOSIS — D279 Benign neoplasm of unspecified ovary: Secondary | ICD-10-CM

## 2017-11-10 DIAGNOSIS — Z3A25 25 weeks gestation of pregnancy: Secondary | ICD-10-CM | POA: Diagnosis not present

## 2017-11-10 DIAGNOSIS — Z363 Encounter for antenatal screening for malformations: Secondary | ICD-10-CM | POA: Insufficient documentation

## 2017-11-10 DIAGNOSIS — O99212 Obesity complicating pregnancy, second trimester: Secondary | ICD-10-CM

## 2017-11-10 DIAGNOSIS — O3482 Maternal care for other abnormalities of pelvic organs, second trimester: Secondary | ICD-10-CM

## 2017-11-11 ENCOUNTER — Other Ambulatory Visit (HOSPITAL_COMMUNITY): Payer: Self-pay | Admitting: *Deleted

## 2017-11-11 DIAGNOSIS — O30043 Twin pregnancy, dichorionic/diamniotic, third trimester: Secondary | ICD-10-CM

## 2017-12-07 ENCOUNTER — Ambulatory Visit (HOSPITAL_COMMUNITY)
Admission: RE | Admit: 2017-12-07 | Discharge: 2017-12-07 | Disposition: A | Discharge status: Home / Self Care | Payer: 59 | Source: Ambulatory Visit | Attending: Obstetrics and Gynecology | Admitting: Obstetrics and Gynecology

## 2017-12-07 ENCOUNTER — Encounter (HOSPITAL_COMMUNITY): Payer: Self-pay

## 2017-12-07 DIAGNOSIS — Z362 Encounter for other antenatal screening follow-up: Secondary | ICD-10-CM

## 2017-12-07 DIAGNOSIS — O30043 Twin pregnancy, dichorionic/diamniotic, third trimester: Secondary | ICD-10-CM | POA: Diagnosis not present

## 2017-12-07 DIAGNOSIS — O99213 Obesity complicating pregnancy, third trimester: Secondary | ICD-10-CM | POA: Diagnosis not present

## 2017-12-07 DIAGNOSIS — Z3A29 29 weeks gestation of pregnancy: Secondary | ICD-10-CM | POA: Insufficient documentation

## 2017-12-08 ENCOUNTER — Other Ambulatory Visit (HOSPITAL_COMMUNITY): Payer: Self-pay | Admitting: *Deleted

## 2017-12-08 DIAGNOSIS — O30043 Twin pregnancy, dichorionic/diamniotic, third trimester: Secondary | ICD-10-CM

## 2017-12-22 ENCOUNTER — Other Ambulatory Visit (HOSPITAL_COMMUNITY): Payer: Self-pay | Admitting: Obstetrics and Gynecology

## 2017-12-22 DIAGNOSIS — Z3A33 33 weeks gestation of pregnancy: Secondary | ICD-10-CM

## 2017-12-22 DIAGNOSIS — O99213 Obesity complicating pregnancy, third trimester: Secondary | ICD-10-CM

## 2017-12-22 DIAGNOSIS — Z3A34 34 weeks gestation of pregnancy: Secondary | ICD-10-CM

## 2017-12-22 DIAGNOSIS — O30043 Twin pregnancy, dichorionic/diamniotic, third trimester: Secondary | ICD-10-CM

## 2017-12-28 ENCOUNTER — Encounter (HOSPITAL_COMMUNITY): Payer: Self-pay

## 2017-12-28 ENCOUNTER — Ambulatory Visit (HOSPITAL_COMMUNITY)
Admission: RE | Admit: 2017-12-28 | Discharge: 2017-12-28 | Disposition: A | Discharge status: Home / Self Care | Payer: 59 | Source: Ambulatory Visit | Attending: Obstetrics and Gynecology | Admitting: Obstetrics and Gynecology

## 2018-01-04 ENCOUNTER — Inpatient Hospital Stay (HOSPITAL_COMMUNITY)
Admission: AD | Admit: 2018-01-04 | Discharge: 2018-01-07 | DRG: 785 | Disposition: A | Discharge status: Home / Self Care | Payer: 59 | Attending: Obstetrics and Gynecology | Admitting: Obstetrics and Gynecology

## 2018-01-04 ENCOUNTER — Inpatient Hospital Stay (HOSPITAL_COMMUNITY): Payer: 59

## 2018-01-04 ENCOUNTER — Inpatient Hospital Stay (HOSPITAL_BASED_OUTPATIENT_CLINIC_OR_DEPARTMENT_OTHER): Payer: 59

## 2018-01-04 ENCOUNTER — Ambulatory Visit (HOSPITAL_BASED_OUTPATIENT_CLINIC_OR_DEPARTMENT_OTHER)
Admission: RE | Admit: 2018-01-04 | Discharge: 2018-01-04 | Disposition: A | Discharge status: Home / Self Care | Payer: 59 | Source: Ambulatory Visit | Attending: Obstetrics and Gynecology | Admitting: Obstetrics and Gynecology

## 2018-01-04 ENCOUNTER — Encounter (HOSPITAL_COMMUNITY): Payer: Self-pay

## 2018-01-04 ENCOUNTER — Encounter (HOSPITAL_COMMUNITY)
Admission: AD | Disposition: A | Discharge status: Home / Self Care | Payer: Self-pay | Source: Home / Self Care | Attending: Obstetrics and Gynecology

## 2018-01-04 ENCOUNTER — Inpatient Hospital Stay (HOSPITAL_COMMUNITY): Payer: 59 | Admitting: Anesthesiology

## 2018-01-04 ENCOUNTER — Encounter (HOSPITAL_COMMUNITY): Payer: Self-pay | Admitting: *Deleted

## 2018-01-04 DIAGNOSIS — O368390 Maternal care for abnormalities of the fetal heart rate or rhythm, unspecified trimester, not applicable or unspecified: Secondary | ICD-10-CM

## 2018-01-04 DIAGNOSIS — O3413 Maternal care for benign tumor of corpus uteri, third trimester: Secondary | ICD-10-CM | POA: Diagnosis present

## 2018-01-04 DIAGNOSIS — Z87891 Personal history of nicotine dependence: Secondary | ICD-10-CM | POA: Diagnosis not present

## 2018-01-04 DIAGNOSIS — D271 Benign neoplasm of left ovary: Secondary | ICD-10-CM | POA: Diagnosis present

## 2018-01-04 DIAGNOSIS — O99213 Obesity complicating pregnancy, third trimester: Secondary | ICD-10-CM | POA: Insufficient documentation

## 2018-01-04 DIAGNOSIS — O1002 Pre-existing essential hypertension complicating childbirth: Principal | ICD-10-CM | POA: Diagnosis present

## 2018-01-04 DIAGNOSIS — O99214 Obesity complicating childbirth: Secondary | ICD-10-CM | POA: Diagnosis present

## 2018-01-04 DIAGNOSIS — O30043 Twin pregnancy, dichorionic/diamniotic, third trimester: Secondary | ICD-10-CM

## 2018-01-04 DIAGNOSIS — O3483 Maternal care for other abnormalities of pelvic organs, third trimester: Secondary | ICD-10-CM

## 2018-01-04 DIAGNOSIS — Z3A33 33 weeks gestation of pregnancy: Secondary | ICD-10-CM

## 2018-01-04 DIAGNOSIS — D279 Benign neoplasm of unspecified ovary: Secondary | ICD-10-CM

## 2018-01-04 DIAGNOSIS — D259 Leiomyoma of uterus, unspecified: Secondary | ICD-10-CM | POA: Diagnosis present

## 2018-01-04 DIAGNOSIS — O321XX1 Maternal care for breech presentation, fetus 1: Secondary | ICD-10-CM | POA: Diagnosis present

## 2018-01-04 HISTORY — PX: UNILATERAL SALPINGECTOMY: SHX6160

## 2018-01-04 LAB — CBC
HCT: 36.5 % (ref 36.0–46.0)
Hemoglobin: 12.6 g/dL (ref 12.0–15.0)
MCH: 28.2 pg (ref 26.0–34.0)
MCHC: 34.5 g/dL (ref 30.0–36.0)
MCV: 81.7 fL (ref 80.0–100.0)
PLATELETS: 319 10*3/uL (ref 150–400)
RBC: 4.47 MIL/uL (ref 3.87–5.11)
RDW: 12.9 % (ref 11.5–15.5)
WBC: 11.2 10*3/uL — ABNORMAL HIGH (ref 4.0–10.5)
nRBC: 0 % (ref 0.0–0.2)

## 2018-01-04 LAB — TYPE AND SCREEN
ABO/RH(D): O POS
Antibody Screen: NEGATIVE

## 2018-01-04 LAB — CREATININE, SERUM
CREATININE: 0.76 mg/dL (ref 0.44–1.00)
GFR calc Af Amer: >60 mL/min (ref >60)

## 2018-01-04 LAB — ABO/RH: ABO/RH(D): O POS

## 2018-01-04 SURGERY — UNILATERAL SALPINGECTOMY
Anesthesia: Spinal | Wound class: Clean Contaminated

## 2018-01-04 MED ORDER — SIMETHICONE 80 MG PO CHEW
80.0000 mg | CHEWABLE_TABLET | ORAL | Status: DC | PRN
  Administered 2018-01-04 – 2018-01-05 (×2): 80 mg via ORAL
  Filled 2018-01-04 (×2): qty 1

## 2018-01-04 MED ORDER — COCONUT OIL OIL: 1.0000 "application " | TOPICAL_OIL | Status: DC | PRN

## 2018-01-04 MED ORDER — LACTATED RINGERS IV BOLUS
1000.0000 mL | Freq: Once | INTRAVENOUS | Status: AC
  Administered 2018-01-04: 400 mL via INTRAVENOUS
  Administered 2018-01-04: 1000 mL via INTRAVENOUS
  Administered 2018-01-04: 500 mL via INTRAVENOUS

## 2018-01-04 MED ORDER — MORPHINE SULFATE (PF) 0.5 MG/ML IJ SOLN
INTRAMUSCULAR | Status: DC | PRN
  Administered 2018-01-04: .15 mg via EPIDURAL

## 2018-01-04 MED ORDER — WITCH HAZEL-GLYCERIN EX PADS: 1.0000 "application " | MEDICATED_PAD | CUTANEOUS | Status: DC | PRN

## 2018-01-04 MED ORDER — ZOLPIDEM TARTRATE 5 MG PO TABS: 5.0000 mg | ORAL_TABLET | Freq: Every evening | ORAL | Status: DC | PRN

## 2018-01-04 MED ORDER — SOD CITRATE-CITRIC ACID 500-334 MG/5ML PO SOLN
30.0000 mL | Freq: Once | ORAL | Status: AC
  Administered 2018-01-04: 30 mL via ORAL
  Filled 2018-01-04: qty 15

## 2018-01-04 MED ORDER — MEPERIDINE HCL 25 MG/ML IJ SOLN: 6.2500 mg | INTRAMUSCULAR | Status: DC | PRN

## 2018-01-04 MED ORDER — SODIUM CHLORIDE 0.9% FLUSH: 3.0000 mL | INTRAVENOUS | Status: DC | PRN

## 2018-01-04 MED ORDER — CEFAZOLIN (ANCEF) 1 G IV SOLR: 3.0000 g | INTRAVENOUS | Status: DC

## 2018-01-04 MED ORDER — OXYCODONE-ACETAMINOPHEN 5-325 MG PO TABS: 1.0000 | ORAL_TABLET | ORAL | Status: DC | PRN

## 2018-01-04 MED ORDER — IBUPROFEN 600 MG PO TABS
600.0000 mg | ORAL_TABLET | Freq: Four times a day (QID) | ORAL | Status: DC
  Administered 2018-01-04 – 2018-01-07 (×11): 600 mg via ORAL
  Filled 2018-01-04 (×12): qty 1

## 2018-01-04 MED ORDER — METOCLOPRAMIDE HCL 5 MG/ML IJ SOLN
INTRAMUSCULAR | Status: AC
  Filled 2018-01-04: qty 2

## 2018-01-04 MED ORDER — SENNOSIDES-DOCUSATE SODIUM 8.6-50 MG PO TABS
2.0000 | ORAL_TABLET | ORAL | Status: DC
  Administered 2018-01-04 – 2018-01-06 (×3): 2 via ORAL
  Filled 2018-01-04 (×3): qty 2

## 2018-01-04 MED ORDER — DIPHENHYDRAMINE HCL 50 MG/ML IJ SOLN
INTRAMUSCULAR | Status: DC | PRN
  Administered 2018-01-04 (×2): 12.5 mg via INTRAVENOUS

## 2018-01-04 MED ORDER — FENTANYL CITRATE (PF) 100 MCG/2ML IJ SOLN
INTRAMUSCULAR | Status: DC | PRN
  Administered 2018-01-04: 15 ug via INTRAVENOUS

## 2018-01-04 MED ORDER — DEXTROSE 5 % IV SOLN
3.0000 g | Freq: Once | INTRAVENOUS | Status: AC
  Administered 2018-01-04: 3 g via INTRAVENOUS
  Filled 2018-01-04: qty 3

## 2018-01-04 MED ORDER — PRENATAL MULTIVITAMIN CH
1.0000 | ORAL_TABLET | Freq: Every day | ORAL | Status: DC
  Administered 2018-01-05 – 2018-01-07 (×3): 1 via ORAL
  Filled 2018-01-04 (×3): qty 1

## 2018-01-04 MED ORDER — ONDANSETRON HCL 4 MG/2ML IJ SOLN: 4.0000 mg | Freq: Three times a day (TID) | INTRAMUSCULAR | Status: DC | PRN

## 2018-01-04 MED ORDER — ENOXAPARIN SODIUM 80 MG/0.8ML ~~LOC~~ SOLN
0.5000 mg/kg | SUBCUTANEOUS | Status: DC
  Administered 2018-01-05 – 2018-01-06 (×2): 80 mg via SUBCUTANEOUS
  Filled 2018-01-04 (×4): qty 0.8

## 2018-01-04 MED ORDER — MORPHINE SULFATE (PF) 0.5 MG/ML IJ SOLN
INTRAMUSCULAR | Status: AC
  Filled 2018-01-04: qty 10

## 2018-01-04 MED ORDER — DIBUCAINE 1 % RE OINT: 1.0000 "application " | TOPICAL_OINTMENT | RECTAL | Status: DC | PRN

## 2018-01-04 MED ORDER — SIMETHICONE 80 MG PO CHEW
80.0000 mg | CHEWABLE_TABLET | ORAL | Status: DC
  Administered 2018-01-04 – 2018-01-06 (×3): 80 mg via ORAL
  Filled 2018-01-04 (×3): qty 1

## 2018-01-04 MED ORDER — FENTANYL CITRATE (PF) 100 MCG/2ML IJ SOLN
INTRAMUSCULAR | Status: AC
  Filled 2018-01-04: qty 2

## 2018-01-04 MED ORDER — PHENYLEPHRINE 8 MG IN D5W 100 ML (0.08MG/ML) PREMIX OPTIME
INJECTION | INTRAVENOUS | Status: DC | PRN
  Administered 2018-01-04: 50 ug/min via INTRAVENOUS

## 2018-01-04 MED ORDER — NALOXONE HCL 0.4 MG/ML IJ SOLN: 0.4000 mg | INTRAMUSCULAR | Status: DC | PRN

## 2018-01-04 MED ORDER — DIPHENHYDRAMINE HCL 25 MG PO CAPS: 25.0000 mg | ORAL_CAPSULE | Freq: Four times a day (QID) | ORAL | Status: DC | PRN

## 2018-01-04 MED ORDER — OXYTOCIN 10 UNIT/ML IJ SOLN
INTRAMUSCULAR | Status: AC
  Filled 2018-01-04: qty 4

## 2018-01-04 MED ORDER — OXYCODONE-ACETAMINOPHEN 5-325 MG PO TABS
2.0000 | ORAL_TABLET | ORAL | Status: DC | PRN
  Administered 2018-01-05 – 2018-01-06 (×4): 2 via ORAL
  Filled 2018-01-04 (×4): qty 2

## 2018-01-04 MED ORDER — DIPHENHYDRAMINE HCL 50 MG/ML IJ SOLN
INTRAMUSCULAR | Status: AC
  Filled 2018-01-04: qty 1

## 2018-01-04 MED ORDER — METOCLOPRAMIDE HCL 5 MG/ML IJ SOLN
INTRAMUSCULAR | Status: DC | PRN
  Administered 2018-01-04 (×2): 5 mg via INTRAVENOUS

## 2018-01-04 MED ORDER — ONDANSETRON HCL 4 MG/2ML IJ SOLN
INTRAMUSCULAR | Status: DC | PRN
  Administered 2018-01-04: 4 mg via INTRAVENOUS

## 2018-01-04 MED ORDER — NALOXONE HCL 4 MG/10ML IJ SOLN
1.0000 ug/kg/h | INTRAVENOUS | Status: DC | PRN
  Filled 2018-01-04: qty 5

## 2018-01-04 MED ORDER — LACTATED RINGERS IV SOLN
INTRAVENOUS | Status: DC
  Administered 2018-01-05: 02:00:00 via INTRAVENOUS

## 2018-01-04 MED ORDER — ACETAMINOPHEN 10 MG/ML IV SOLN
1000.0000 mg | Freq: Once | INTRAVENOUS | Status: DC | PRN
  Administered 2018-01-04: 1000 mg via INTRAVENOUS

## 2018-01-04 MED ORDER — DIPHENHYDRAMINE HCL 50 MG/ML IJ SOLN: 12.5000 mg | INTRAMUSCULAR | Status: DC | PRN

## 2018-01-04 MED ORDER — BUPIVACAINE IN DEXTROSE 0.75-8.25 % IT SOLN
INTRATHECAL | Status: DC | PRN
  Administered 2018-01-04: 1.8 mg via INTRATHECAL

## 2018-01-04 MED ORDER — MENTHOL 3 MG MT LOZG: 1.0000 | LOZENGE | OROMUCOSAL | Status: DC | PRN

## 2018-01-04 MED ORDER — SIMETHICONE 80 MG PO CHEW
80.0000 mg | CHEWABLE_TABLET | Freq: Three times a day (TID) | ORAL | Status: DC
  Administered 2018-01-05 – 2018-01-06 (×6): 80 mg via ORAL
  Filled 2018-01-04 (×6): qty 1

## 2018-01-04 MED ORDER — NALBUPHINE HCL 10 MG/ML IJ SOLN: 5.0000 mg | INTRAMUSCULAR | Status: DC | PRN

## 2018-01-04 MED ORDER — PHENYLEPHRINE 40 MCG/ML (10ML) SYRINGE FOR IV PUSH (FOR BLOOD PRESSURE SUPPORT)
PREFILLED_SYRINGE | INTRAVENOUS | Status: DC | PRN
  Administered 2018-01-04: 40 ug via INTRAVENOUS

## 2018-01-04 MED ORDER — OXYTOCIN 40 UNITS IN LACTATED RINGERS INFUSION - SIMPLE MED: 2.5000 [IU]/h | INTRAVENOUS | Status: AC

## 2018-01-04 MED ORDER — LACTATED RINGERS IV SOLN
INTRAVENOUS | Status: DC | PRN
  Administered 2018-01-04: 19:00:00 via INTRAVENOUS

## 2018-01-04 MED ORDER — ONDANSETRON HCL 4 MG/2ML IJ SOLN
INTRAMUSCULAR | Status: AC
  Filled 2018-01-04: qty 2

## 2018-01-04 MED ORDER — TETANUS-DIPHTH-ACELL PERTUSSIS 5-2.5-18.5 LF-MCG/0.5 IM SUSP: 0.5000 mL | Freq: Once | INTRAMUSCULAR | Status: DC

## 2018-01-04 MED ORDER — SODIUM CHLORIDE 0.9 % IR SOLN
Status: DC | PRN
  Administered 2018-01-04: 1

## 2018-01-04 MED ORDER — DIPHENHYDRAMINE HCL 25 MG PO CAPS
25.0000 mg | ORAL_CAPSULE | ORAL | Status: DC | PRN
  Administered 2018-01-04: 25 mg via ORAL
  Filled 2018-01-04 (×2): qty 1

## 2018-01-04 MED ORDER — SCOPOLAMINE 1 MG/3DAYS TD PT72: 1.0000 | MEDICATED_PATCH | Freq: Once | TRANSDERMAL | Status: DC

## 2018-01-04 MED ORDER — MEPERIDINE HCL 25 MG/ML IJ SOLN
INTRAMUSCULAR | Status: DC | PRN
  Administered 2018-01-04 (×2): 12.5 mg via INTRAVENOUS

## 2018-01-04 MED ORDER — NALBUPHINE HCL 10 MG/ML IJ SOLN: 5.0000 mg | Freq: Once | INTRAMUSCULAR | Status: DC | PRN

## 2018-01-04 MED ORDER — MEPERIDINE HCL 25 MG/ML IJ SOLN
INTRAMUSCULAR | Status: AC
  Filled 2018-01-04: qty 1

## 2018-01-04 MED ORDER — PHENYLEPHRINE 8 MG IN D5W 100 ML (0.08MG/ML) PREMIX OPTIME
INJECTION | INTRAVENOUS | Status: AC
  Filled 2018-01-04: qty 100

## 2018-01-04 MED ORDER — ACETAMINOPHEN 325 MG PO TABS
650.0000 mg | ORAL_TABLET | ORAL | Status: DC | PRN
  Administered 2018-01-06: 650 mg via ORAL
  Filled 2018-01-04: qty 2

## 2018-01-04 MED ORDER — ACETAMINOPHEN 10 MG/ML IV SOLN
INTRAVENOUS | Status: AC
  Administered 2018-01-04: 1000 mg via INTRAVENOUS
  Filled 2018-01-04: qty 100

## 2018-01-04 SURGICAL SUPPLY — 42 items
BENZOIN TINCTURE PRP APPL 2/3 (GAUZE/BANDAGES/DRESSINGS) ×4 IMPLANT
CHLORAPREP W/TINT 26ML (MISCELLANEOUS) ×4 IMPLANT
CLAMP CORD UMBIL (MISCELLANEOUS) IMPLANT
CLOSURE WOUND 1/2 X4 (GAUZE/BANDAGES/DRESSINGS) ×2
CLOTH BEACON ORANGE TIMEOUT ST (SAFETY) ×4 IMPLANT
DRSG OPSITE POSTOP 4X10 (GAUZE/BANDAGES/DRESSINGS) ×4 IMPLANT
ELECT REM PT RETURN 9FT ADLT (ELECTROSURGICAL) ×4
ELECTRODE REM PT RTRN 9FT ADLT (ELECTROSURGICAL) ×2 IMPLANT
EXTENDER TRAXI PANNICULUS (MISCELLANEOUS) ×4 IMPLANT
EXTRACTOR VACUUM M CUP 4 TUBE (SUCTIONS) IMPLANT
EXTRACTOR VACUUM M CUP 4' TUBE (SUCTIONS)
GLOVE BIOGEL PI IND STRL 6.5 (GLOVE) ×2 IMPLANT
GLOVE BIOGEL PI IND STRL 7.0 (GLOVE) ×2 IMPLANT
GLOVE BIOGEL PI INDICATOR 6.5 (GLOVE) ×2
GLOVE BIOGEL PI INDICATOR 7.0 (GLOVE) ×2
GLOVE ECLIPSE 6.5 STRL STRAW (GLOVE) ×4 IMPLANT
GOWN STRL REUS W/TWL LRG LVL3 (GOWN DISPOSABLE) ×8 IMPLANT
KIT ABG SYR 3ML LUER SLIP (SYRINGE) IMPLANT
LIGASURE IMPACT 36 18CM CVD LR (INSTRUMENTS) IMPLANT
NEEDLE HYPO 25X5/8 SAFETYGLIDE (NEEDLE) IMPLANT
NS IRRIG 1000ML POUR BTL (IV SOLUTION) ×4 IMPLANT
PACK C SECTION WH (CUSTOM PROCEDURE TRAY) ×4 IMPLANT
PAD ABD 7.5X8 STRL (GAUZE/BANDAGES/DRESSINGS) IMPLANT
PAD OB MATERNITY 4.3X12.25 (PERSONAL CARE ITEMS) ×4 IMPLANT
PENCIL SMOKE EVAC W/HOLSTER (ELECTROSURGICAL) ×4 IMPLANT
RETAINER VISCERAL (MISCELLANEOUS) ×4 IMPLANT
RETRACTOR TRAXI PANNICULUS (MISCELLANEOUS) ×2 IMPLANT
RTRCTR C-SECT PINK 25CM LRG (MISCELLANEOUS) ×4 IMPLANT
SPONGE LAP 18X18 RF (DISPOSABLE) ×12 IMPLANT
SPONGE LAP 18X18 X RAY DECT (DISPOSABLE) ×4 IMPLANT
STRIP CLOSURE SKIN 1/2X4 (GAUZE/BANDAGES/DRESSINGS) ×6 IMPLANT
SUT MON AB 2-0 CT1 27 (SUTURE) ×4 IMPLANT
SUT MON AB 4-0 PS1 27 (SUTURE) IMPLANT
SUT PDS AB 0 CTX 60 (SUTURE) IMPLANT
SUT PLAIN 2 0 XLH (SUTURE) IMPLANT
SUT VIC AB 0 CTX 36 (SUTURE) ×8
SUT VIC AB 0 CTX36XBRD ANBCTRL (SUTURE) ×8 IMPLANT
SUT VIC AB 4-0 KS 27 (SUTURE) IMPLANT
TOWEL OR 17X24 6PK STRL BLUE (TOWEL DISPOSABLE) ×4 IMPLANT
TRAXI PANNICULUS EXTENDER (MISCELLANEOUS) ×4
TRAXI PANNICULUS RETRACTOR (MISCELLANEOUS) ×2
TRAY FOLEY W/BAG SLVR 14FR LF (SET/KITS/TRAYS/PACK) ×4 IMPLANT

## 2018-01-04 NOTE — ED Notes (Signed)
Report called to Ginger, RN in MAU.

## 2018-01-04 NOTE — H&P (Signed)
32 y.o. [redacted]w[redacted]d  G1P0 comes in c/o from MFM office to MAU for NSTs of twin pregnancy.  Pt seen in MFM for BPPs: was alerted by Dr. Yates Decamp that Baby A was 6/8 and baby B 2/8.  He recommended NSTs in MAU and if Baby B was reactive could administer steroids and monitor continuously. In MAU staff was unable to monitor [place] continuously.  MFM ultrasonographer was requested twice to locate [facility] and assess rate.  Rates were reassuring intermittently from 120s to 160s for both babies, but due to pt habitus it was not possible to maintain continuous monitoring for more than a minute or two.  Dr. Yates Decamp was called and he recommended proceeding to C/S.  Past Medical History:  Diagnosis Date  . Anxiety   . Depression   . Dermoid cyst    per pt, left ovary  . Migraines   . Obesity   . Polycystic ovarian disease   . Seasonal allergies   . Vaginal Pap smear, abnormal     Past Surgical History:  Procedure Laterality Date  . ADENOIDECTOMY    . TONSILLECTOMY      OB History  Gravida Para Term Preterm AB Living  1         0  SAB TAB Ectopic Multiple Live Births               # Outcome Date GA Lbr Len/2nd Weight Sex Delivery Anes PTL Lv  1 Current             Social History   Socioeconomic History  . Marital status: Single    Spouse name: Not on file  . Number of children: Not on file  . Years of education: Not on file  . Highest education level: Not on file  Occupational History  . Not on file  Social Needs  . Financial resource strain: Not on file  . Food insecurity:    Worry: Not on file    Inability: Not on file  . Transportation needs:    Medical: Not on file    Non-medical: Not on file  Tobacco Use  . Smoking status: Former Smoker    Packs/day: 0.25    Types: Cigarettes    Last attempt to quit: 06/01/2017    Years since quitting: 0.5  . Smokeless tobacco: Never Used  . Tobacco comment: stopped with pregnancy  Substance and Sexual Activity  . Alcohol use: Not Currently  . Drug  use: No  . Sexual activity: Yes    Birth control/protection: None  Lifestyle  . Physical activity:    Days per week: Not on file    Minutes per session: Not on file  . Stress: Not on file  Relationships  . Social connections:    Talks on phone: Not on file    Gets together: Not on file    Attends religious service: Not on file    Active member of club or organization: Not on file    Attends meetings of clubs or organizations: Not on file    Relationship status: Not on file  . Intimate partner violence:    Fear of current or ex partner: Not on file    Emotionally abused: Not on file    Physically abused: Not on file    Forced sexual activity: Not on file  Other Topics Concern  . Not on file  Social History Narrative  . Not on file   Doxycycline    Prenatal Transfer Tool  Maternal Diabetes: No Genetic Screening: Declined Maternal Ultrasounds/Referrals: Normal Fetal Ultrasounds or other Referrals:  Referred to Materal Fetal Medicine  Maternal Substance Abuse:  No Significant Maternal Medications:  None Significant Maternal Lab Results: Lab values include: Other: unknown  Other PNC: morbid obesity, CHTN, IVF pregnancy   Vitals:   01/04/18 1601 01/04/18 1602  BP: (!) 156/75 (!) 156/75  Pulse: 100 99  Resp: 18   Temp: 98.8 F (37.1 C)   TempSrc: Oral   SpO2: 100%     Lungs/Cor:  NAD Abdomen:  soft, gravid Ex:  no cords, erythema   A/P   Admit with Baby B BPP 2/8 MFM recommends proceeding to c/s now rather than attempting further monitoring  GBS unknonw  3g ancef  Other routine pre-op care  [place], Cas[place] Bilis

## 2018-01-04 NOTE — Anesthesia Preprocedure Evaluation (Addendum)
Anesthesia Evaluation  Patient identified by MRN, date of birth, ID band Patient awake    Reviewed: Allergy & Precautions, NPO status , Patient's Chart, lab work & pertinent test results  Airway Mallampati: III  TM Distance: >3 FB Neck ROM: Full    Dental no notable dental hx. (+) Teeth Intact, Dental Advisory Given   Pulmonary asthma , former smoker,    Pulmonary exam normal breath sounds clear to auscultation       Cardiovascular hypertension, Normal cardiovascular exam Rhythm:Regular Rate:Normal     Neuro/Psych  Headaches, PSYCHIATRIC DISORDERS Anxiety Depression    GI/Hepatic negative GI ROS, Neg liver ROS,   Endo/Other  Morbid obesity  Renal/GU negative Renal ROS  negative genitourinary   Musculoskeletal negative musculoskeletal ROS (+)   Abdominal   Peds  Hematology negative hematology ROS (+)   Anesthesia Other Findings twins  Reproductive/Obstetrics (+) Pregnancy                           Anesthesia Physical Anesthesia Plan  ASA: III  Anesthesia Plan: Spinal   Post-op Pain Management:    Induction: Intravenous  PONV Risk Score and Plan: 2 and Treatment may vary due to age or medical condition  Airway Management Planned: Natural Airway  Additional Equipment:   Intra-op Plan:   Post-operative Plan:   Informed Consent: I have reviewed the patients History and Physical, chart, labs and discussed the procedure including the risks, benefits and alternatives for the proposed anesthesia with the patient or authorized representative who has indicated his/her understanding and acceptance.   Dental advisory given  Plan Discussed with: CRNA  Anesthesia Plan Comments:         Anesthesia Quick Evaluation

## 2018-01-04 NOTE — Anesthesia Procedure Notes (Addendum)
Spinal  Patient location during procedure: OR Start time: 01/04/2018 5:40 PM End time: 01/04/2018 6:01 PM Staffing Anesthesiologist: Freddrick March, MD Performed: anesthesiologist  Preanesthetic Checklist Completed: patient identified, surgical consent, pre-op evaluation, timeout performed, IV checked, risks and benefits discussed and monitors and equipment checked Spinal Block Patient position: sitting Prep: site prepped and draped and DuraPrep Patient monitoring: cardiac monitor, continuous pulse ox and blood pressure Approach: midline Location: L3-4 Injection technique: single-shot Needle Needle type: Other, Spinocan and Tuohy  Needle gauge: 25 G Needle length: 12.7 cm Needle insertion depth: 8 cm Assessment Sensory level: T4 Additional Notes Functioning IV was confirmed and monitors were applied. Sterile prep and drape, including hand hygiene and sterile gloves were used. The patient was positioned and the spine was prepped. The skin was anesthetized with lidocaine.  Free flow of clear CSF was obtained prior to injecting local anesthetic into the CSF.  The spinal needle aspirated freely following injection.  The needle was carefully withdrawn.  The patient tolerated the procedure well.

## 2018-01-04 NOTE — ED Notes (Signed)
Pt to MAU for evaluation.

## 2018-01-04 NOTE — Transfer of Care (Signed)
Immediate Anesthesia Transfer of Care Note  Patient: Miranda Stewart  Procedure(s) Performed: CESAREAN SECTION (N/A ) UNILATERAL SALPINGECTOMY (Left )  Patient Location: PACU  Anesthesia Type:Spinal  Level of Consciousness: awake, alert  and patient cooperative  Airway & Oxygen Therapy: Patient Spontanous Breathing  Post-op Assessment: Report given to RN, Post -op Vital signs reviewed and stable and Patient moving all extremities X 4  Post vital signs: Reviewed and stable  Last Vitals:  Vitals Value Taken Time  BP    Temp    Pulse    Resp    SpO2      Last Pain:  Vitals:   01/04/18 1601  TempSrc: Oral         Complications: No apparent anesthesia complications

## 2018-01-04 NOTE — ED Notes (Signed)
Report called to [facility], RN.   Waiting for room assignment.

## 2018-01-04 NOTE — Brief Op Note (Addendum)
01/04/2018  7:31 PM  PATIENT:  Miranda Stewart  32 y.o. female  PRE-OPERATIVE DIAGNOSIS:  Non-reassuring fetal status Baby B; primary cesaraen section; di-di twins; chronic hypertension; left ovarian cyst  POST-OPERATIVE DIAGNOSIS:  Non-reassuring fetal status Baby B; primary cesaraen section; di-di twins; chronic hypertension; left ovarian cyst  PROCEDURE:  Low transverse cesarean section, Unilateral left salpingo-oophorectomy  SURGEON:  Surgeon(s) and Role:    Rogue Bussing, Lucia Mccreadie, DO - Primary  ANESTHESIA:   spinal  EBL:  380 mL   FINDINGS: Baby A: female infant breech presentation, APGARS 7/9, wt 3#10; Baby B: female infant, cephalic presentation, APGARS 7/8, wt 3#14.  Normal right tube and ovary, left ovary with approx 12cm cyst, indistinguishable from normal ovarian tissue, left tube normal.  Small 1cm fibroid pedunculated posterior uterus.  SPECIMEN:  Source of Specimen:  cord blood, placenta and left ovary and tube  DISPOSITION OF SPECIMEN:  PATHOLOGY  COUNTS:  YES  PLAN OF CARE: Admit to inpatient   PATIENT DISPOSITION:  PACU - hemodynamically stable.   Delay start of Pharmacological VTE agent (>24hrs) due to surgical blood loss or risk of bleeding: not applicable

## 2018-01-04 NOTE — ED Notes (Signed)
States she has been vomiting all week. Denies fever. States she has been trying to drink water and eat some applesauce. No solid food. C/O LUQ pain. No visual changes, no RUQ pain, no edema, no HA's.

## 2018-01-04 NOTE — ED Notes (Signed)
Pt taken directly to room 7, assisted with undressing.  BPP result reported.

## 2018-01-04 NOTE — Anesthesia Postprocedure Evaluation (Signed)
Anesthesia Post Note  Patient: Miranda Stewart  Procedure(s) Performed: CESAREAN SECTION (N/A ) UNILATERAL SALPINGECTOMY (Left )     Patient location during evaluation: PACU Anesthesia Type: Spinal Level of consciousness: oriented and awake and alert Pain management: pain level controlled Vital Signs Assessment: post-procedure vital signs reviewed and stable Respiratory status: spontaneous breathing, respiratory function stable and nonlabored ventilation Cardiovascular status: blood pressure returned to baseline and stable Postop Assessment: no headache, no backache, no apparent nausea or vomiting, patient able to bend at knees and spinal receding Anesthetic complications: no    Last Vitals:  Vitals:   01/04/18 2000 01/04/18 2015  BP: 126/74 129/74  Pulse: 63 69  Resp: (!) 24 (!) 23  Temp:    SpO2: 99% 100%    Last Pain:  Vitals:   01/04/18 1945  TempSrc:   PainSc: 0-No pain   Pain Goal:                 Harold Moncus A.

## 2018-01-05 ENCOUNTER — Encounter (HOSPITAL_COMMUNITY): Payer: Self-pay

## 2018-01-05 ENCOUNTER — Other Ambulatory Visit: Payer: Self-pay

## 2018-01-05 LAB — CBC
HCT: 31.2 % — ABNORMAL LOW (ref 36.0–46.0)
Hemoglobin: 10.8 g/dL — ABNORMAL LOW (ref 12.0–15.0)
MCH: 28.1 pg (ref 26.0–34.0)
MCHC: 34.6 g/dL (ref 30.0–36.0)
MCV: 81 fL (ref 80.0–100.0)
NRBC: 0 % (ref 0.0–0.2)
PLATELETS: 255 10*3/uL (ref 150–400)
RBC: 3.85 MIL/uL — AB (ref 3.87–5.11)
RDW: 12.9 % (ref 11.5–15.5)
WBC: 13.1 10*3/uL — AB (ref 4.0–10.5)

## 2018-01-05 LAB — RPR: RPR Ser Ql: NONREACTIVE

## 2018-01-05 MED ORDER — EPHEDRINE 5 MG/ML INJ
INTRAVENOUS | Status: AC
  Filled 2018-01-05: qty 10

## 2018-01-05 MED ORDER — FAMOTIDINE 20 MG PO TABS
20.0000 mg | ORAL_TABLET | Freq: Two times a day (BID) | ORAL | Status: DC
  Administered 2018-01-05 – 2018-01-06 (×4): 20 mg via ORAL
  Filled 2018-01-05 (×4): qty 1

## 2018-01-05 NOTE — Plan of Care (Signed)
Pts. Condition will continue to improve 

## 2018-01-05 NOTE — Op Note (Signed)
NAME: HARLEM, THRESHER MEDICAL RECORD [id number] ACCOUNT [id number] DATE OF BIRTH:[birth year] FACILITY: [facility] LOCATION: [place], [place]  OPERATIVE REPORT  DATE OF PROCEDURE:  01/04/2018  PREOPERATIVE DIAGNOSES:  Nonreassuring fetal status of baby B with 2/8 biophysical profile in MFM office, primary cesarean section, Di/Di twins, chronic hypertension, left ovarian cyst.  POSTOPERATIVE DIAGNOSIS:  Nonreassuring fetal status of baby B with 2/8 biophysical profile in MFM office, primary cesarean section, Di/Di twins, chronic hypertension, left ovarian cyst.    PROCEDURE:  Primary low transverse cesarean section and left salpingo-oophorectomy.  SURGEON:  Allyn Kenner, DO  ANESTHESIA:  Spinal.  ESTIMATED BLOOD LOSS:  380 mL.  FINDINGS:  Baby A female infant, breech presentation, Apgars 7 and 9, weight 3 pounds 10 ounces.  Baby B female infant, cephalic presentation, Apgars 7 and 8, weight 3 pounds 14 ounces.  Normal right tube and ovary, left ovary with approximately 12 cm cyst  indistinguishable from normal ovarian tissue, left tube, normal.  A small 1 cm fibroid pedunculated on the posterior uterus.  SPECIMENS:  Blood for cord pH, placenta and left ovary and tube.  COMPLICATIONS:  None.  CONDITION:  Stable to PACU.  DESCRIPTION OF PROCEDURE:  The patient was taken to the operating room where spinal anesthesia was administered and found to be adequate.  Fetal heart tones were obtained by MFM ultrasonographer and found to be in normal range.  The patient was then  prepped and draped in the normal sterile fashion in dorsal supine position with a leftward tilt.  A Pfannenstiel skin incision was made with the scalpel and carried down to underlying layer of fascia with Bovie cautery.  The fascia was incised with the  scalpel at the midline and extended laterally with Mayo scissors.  Kocher clamps were placed at the superior aspect of the fascial incision and rectus  muscles were dissected off bluntly and sharply.  Kocher clamps were then placed at the inferior aspect  of the fascial incision.  Rectus muscles were dissected off bluntly and sharply.  Hemostat was used to separate rectus muscle superiorly and with good visualization of the peritoneum and underlying structures.  The peritoneal tissue was incised with  Metzenbaum scissors.  The peritoneal incision was extended by lateral traction.  The abdomen was surveyed manually as well as the pelvis.  An Administrator, sports was then placed.  The vesicouterine peritoneum was identified, tented, and entered sharply  with Metzenbaum scissors and extended laterally with Metzenbaum scissors.  The bladder flap was then developed digitally.  A low transverse cesarean incision was made with the scalpel and the amniotic sac of baby A was entered sharply.  Clear fluid was  noted.  The infant's hips were located and hips, feet and legs were delivered without difficulty.  The infant was rotated from side to side with gentle sweeping motion of both arms anteriorly and down the chest.  The infant was kept in flexed head  position and with fundal pressure delivered without difficulty.  The cord was doubly clamped, cut, and infant was handed off to awaiting neonatology.  A segment of cord was then collected and set aside for surgical technician to obtain cord blood and  send to the lab.  Baby B's amniotic sac was entered bluntly with Allis clamps.  Head immediately delivered without difficulty from the hysterotomy incision followed by the remainder of the infant's body.  The cord was doubly clamped, cut, and infant was  handed off to awaiting neonatology.  A  section of cord was removed and set aside for pH blood collection by the surgical technician.  The external uterus was massaged and with gentle traction on the umbilical cords, the placentas were removed without  difficulty.  The uterine incision and cavity was swept clear of all  clot and debris and the incision was reapproximated and closed with Vicryl in a running locked fashion followed by second layer of Vicryl vertical imbrication.  Excellent hemostasis was  noted.  Both ovaries and tubes were examined.  The left ovary and tube were removed by transecting the uteroovarian suspensory ligament and the mesosalpinx with LigaSure.  Excellent hemostasis was noted.  Both tube and ovary were handed off to be sent to  pathology.  Uterine incision was reexamined and found to be hemostatic.  Peritoneum was reapproximated and closed with Monocryl in a running fashion.  With that same Monocryl suture, reapproximation of rectus muscles and for continuing sutures was  performed to correct diastasis.  All subcutaneous tissue was examined and found to be hemostatic.  Fascia was reapproximated and closed with looped PDS in a running fashion.  Subcutaneous tissue was irrigated, dried and Bovie cautery was used to create  hemostasis with small areas of light oozing.  Plain gut suture was placed subcutaneously and 5 interrupted sutures to reapproximate tissue and close dead space.  Skin was reapproximated with Vicryl and closed subcuticularly.  The patient tolerated the  procedure well.  Sponge, lap and needle counts were correct x2.  The patient was taken to recovery in stable condition.  TN/NUANCE  D:01/04/2018 T:01/05/2018 JOB:[id number]

## 2018-01-05 NOTE — Lactation Note (Signed)
This note was copied from a baby's chart. Lactation Consultation Note: Initial visit with this mom of 55 w 2 d twins in NICU. Mom reports she has pumped once and obtained drops of Colostrum. Mom reports she has slept during the night. Encouraged to pump 8 times/24 hours to promote milk supply for babies. Mom to eat breakfast and then pump. Reviewed setup, use and cleaning of pump pieces. BF brochure and NICU booklet given.Marland Kitchen Has [address] [place]- I will fax referral to them for pump for home. Assisted with hand expression and mom easily able to hand express several large drops of Colostrum. No questions at present. To call for assist prn  Patient Name: Miranda Stewart BTDVV'O Date: 01/05/2018 Reason for consult: Initial assessment;Preterm <34wks;Multiple gestation;Primapara   Maternal Data Formula Feeding for Exclusion: No Has patient been taught Hand Expression?: Yes Does the patient have breastfeeding experience prior to this delivery?: No  Feeding    LATCH Score                   Interventions Interventions: Hand express  Lactation Tools Discussed/Used WIC Program: Yes Pump Review: Setup, frequency, and cleaning   Consult Status Consult Status: Follow-up Date: 01/06/18 Follow-up type: In-patient    Truddie Crumble 01/05/2018, 8:39 AM

## 2018-01-05 NOTE — Progress Notes (Signed)
Honeycomb changed per MD order

## 2018-01-05 NOTE — Progress Notes (Signed)
  Patient is eating, ambulating, voiding.  Pain control is good.  Vitals:   01/05/18 0400 01/05/18 0500 01/05/18 0553 01/05/18 0559  BP:   122/83 (!) 146/90  Pulse:   70 67  Resp:   18   Temp:   (!) 97.3 F (36.3 C)   TempSrc:   Oral   SpO2: 100% 100% 100% 100%    lungs:   clear to auscultation cor:    RRR Abdomen:  soft, appropriate tenderness, incisions intact and without erythema or exudate ex:    no cords   Lab Results  Component Value Date   WBC 13.1 (H) 01/05/2018   HGB 10.8 (L) 01/05/2018   HCT 31.2 (L) 01/05/2018   MCV 81.0 01/05/2018   PLT 255 01/05/2018    --/--/O POS, O POS Performed at [facility], [address], [place], [place] [address]  (11/13 1654)/R  A/P    Post operative day 1 di/di twins at 6 weeks with nonreasuring testing.  Routine post op and postpartum care.  Expect d/c in 2 days..  Percocet for pain control. BP mildly elevated today- will watch.

## 2018-01-06 LAB — CBC
HCT: 34.6 % — ABNORMAL LOW (ref 36.0–46.0)
Hemoglobin: 11.8 g/dL — ABNORMAL LOW (ref 12.0–15.0)
MCH: 27.8 pg (ref 26.0–34.0)
MCHC: 34.1 g/dL (ref 30.0–36.0)
MCV: 81.6 fL (ref 80.0–100.0)
PLATELETS: 294 10*3/uL (ref 150–400)
RBC: 4.24 MIL/uL (ref 3.87–5.11)
RDW: 12.9 % (ref 11.5–15.5)
WBC: 10.1 10*3/uL (ref 4.0–10.5)
nRBC: 0 % (ref 0.0–0.2)

## 2018-01-06 LAB — COMPREHENSIVE METABOLIC PANEL
ALT: 34 U/L (ref 0–44)
ANION GAP: 8 (ref 5–15)
AST: 39 U/L (ref 15–41)
Albumin: 2.7 g/dL — ABNORMAL LOW (ref 3.5–5.0)
Alkaline Phosphatase: 127 U/L — ABNORMAL HIGH (ref 38–126)
BILIRUBIN TOTAL: 0.3 mg/dL (ref 0.3–1.2)
BUN: 10 mg/dL (ref 6–20)
CO2: 20 mmol/L — ABNORMAL LOW (ref 22–32)
Calcium: 8.8 mg/dL — ABNORMAL LOW (ref 8.9–10.3)
Chloride: 106 mmol/L (ref 98–111)
Creatinine, Ser: 0.84 mg/dL (ref 0.44–1.00)
Glucose, Bld: 110 mg/dL — ABNORMAL HIGH (ref 70–99)
Potassium: 3.9 mmol/L (ref 3.5–5.1)
Sodium: 134 mmol/L — ABNORMAL LOW (ref 135–145)
TOTAL PROTEIN: 6.1 g/dL — AB (ref 6.5–8.1)

## 2018-01-06 MED ORDER — NIFEDIPINE ER OSMOTIC RELEASE 30 MG PO TB24
30.0000 mg | ORAL_TABLET | Freq: Every day | ORAL | Status: DC
  Administered 2018-01-06 – 2018-01-07 (×2): 30 mg via ORAL
  Filled 2018-01-06 (×2): qty 1

## 2018-01-06 NOTE — Progress Notes (Signed)
Patient with elevated BPs today, none in severe range.  SBP range 132-150, DBP range 85-95.    Phoned Dr. Ouida Sills with above information.  He stated he would review patient's chart and call back.

## 2018-01-06 NOTE — Progress Notes (Signed)
POD#2 Pt without complaints.  VS - stable IMP/ Stable Plan/ Will discharge to home tomorrow.

## 2018-01-07 MED ORDER — NIFEDIPINE ER 30 MG PO TB24: 30.0000 mg | ORAL_TABLET | Freq: Every day | ORAL | 0 refills | Status: DC

## 2018-01-07 MED ORDER — IBUPROFEN 600 MG PO TABS: 600.0000 mg | ORAL_TABLET | Freq: Four times a day (QID) | ORAL | 0 refills | Status: DC

## 2018-01-07 NOTE — Progress Notes (Signed)
POD#3 Pt has no complaints. Desires to go home. B/Ps improved with procardia. Novinger labs wnl. She has a way to check b/ps at home Will ambulate at home PLAN/ Will discharge to home             RTO in one week             Continue procardia

## 2018-01-07 NOTE — Discharge Summary (Signed)
Obstetric Discharge Summary Reason for Admission: non reassuring FHT on one twin Prenatal Procedures: NST and ultrasound Intrapartum Procedures: cesarean: low cervical, transverse and removal of ovary and FT Postpartum Procedures: none Complications-Operative and Postpartum: none Hemoglobin  Date Value Ref Range Status  01/06/2018 11.8 (L) 12.0 - 15.0 g/dL Final   HCT  Date Value Ref Range Status  01/06/2018 34.6 (L) 36.0 - 46.0 % Final    Physical Exam:  General: alert Lochia: appropriate Uterine Fundus: firm Incision: healing well DVT Evaluation: No evidence of DVT seen on physical exam.  Discharge Diagnoses: twin pregnancy, non reassuring FHT, Dermoid cyst  Discharge Information: Date: 01/07/2018 Activity: pelvic rest Diet: routine Medications: PNV and Ibuprofen Condition: stable Instructions: refer to practice specific booklet Discharge to: home Follow-up Information    Allyn Kenner, DO. Schedule an appointment as soon as possible for a visit in 1 month(s).   Specialty:  Obstetrics and Gynecology Contact information: [address] [place] [address] [phone]           Newborn Data:   [place], [place] [[id number]]  Live born female  Birth Weight: 3 lb 10.6 oz (1660 g) APGAR: 7, 9  Newborn Delivery   Birth date/time:  01/04/2018 18:34:00 Delivery type:  C-Section, Low Transverse Trial of labor:  No C-section categorization:  Primary      [place], Florance [[id number]]  Live born female  Birth Weight: 3 lb 14.4 oz (1770 g) APGAR: 7, 8  Newborn Delivery   Birth date/time:  01/04/2018 18:35:00 Delivery type:  C-Section, Low Transverse Trial of labor:  No C-section categorization:  Primary     Home with in nicu.  ANDERSON,MARK E 01/07/2018, 11:36 AM

## 2018-01-07 NOTE — Discharge Instructions (Signed)

## 2018-01-07 NOTE — Lactation Note (Signed)
This note was copied from a baby's chart. Lactation Consultation Note: Mom reports she pumped 2 times yesterday but is only obtaining drops of Colostrum. Continues hand expression and reports she is able to obtain more than with the pump. Encouraged to try to pump 8 times/24 hours with hand expression also. No questions at present. Has pump from [facility] for home. Reviewed our phone number, and encouraged to call for assist when babies are ready to go to the breast,   Patient Name: Miranda Stewart NIDPO'E Date: 01/07/2018 Reason for consult: Follow-up assessment;Multiple gestation;Preterm <34wks   Maternal Data Has patient been taught Hand Expression?: Yes Does the patient have breastfeeding experience prior to this delivery?: No  Feeding Feeding Type: Donor Breast Milk  LATCH Score                   Interventions    Lactation Tools Discussed/Used WIC Program: Yes   Consult Status Consult Status: Complete    Truddie Crumble 01/07/2018, 7:35 AM

## 2018-01-11 ENCOUNTER — Ambulatory Visit (HOSPITAL_COMMUNITY): Payer: 59

## 2018-01-12 ENCOUNTER — Inpatient Hospital Stay (HOSPITAL_COMMUNITY)
Admission: AD | Admit: 2018-01-12 | Discharge: 2018-01-12 | Disposition: A | Discharge status: Home / Self Care | Payer: 59 | Source: Ambulatory Visit | Attending: Obstetrics and Gynecology | Admitting: Obstetrics and Gynecology

## 2018-01-12 ENCOUNTER — Encounter (HOSPITAL_COMMUNITY): Payer: Self-pay

## 2018-01-12 ENCOUNTER — Other Ambulatory Visit: Payer: Self-pay

## 2018-01-12 DIAGNOSIS — O9089 Other complications of the puerperium, not elsewhere classified: Secondary | ICD-10-CM | POA: Diagnosis present

## 2018-01-12 DIAGNOSIS — O902 Hematoma of obstetric wound: Secondary | ICD-10-CM | POA: Insufficient documentation

## 2018-01-12 MED ORDER — CEFADROXIL 500 MG PO CAPS: 500.0000 mg | ORAL_CAPSULE | Freq: Two times a day (BID) | ORAL | 0 refills | Status: DC

## 2018-01-12 NOTE — MAU Note (Addendum)
Getting ready to get into the shower, felt something warm, noted there was blood trickling from incision. boyfriend looked, small area is open, looks like a cut. Other wise is ok.

## 2018-01-12 NOTE — MAU Provider Note (Signed)
[facility]ulty [facility] OB/GYN Attending MAU Note  Chief Complaint: Drainage from Incision  First Provider Initiated Contact with Patient 01/12/18 Miranda Stewart is a 32 y.o. G1P0102 s/p LTCS by Dr. Rogue Bussing [facility]) for twins on 01/04/2018 who presents with report of bloody incisional drainage. Had the honeycomb dressing on her incision until yesterday, it was removed in office. This morning, she noticed bloody drainage running down her leg. Minimal incisional pain, no purulent drainage. Denies any current abnormal vaginal discharge, fevers, chills, sweats, dysuria, nausea, vomiting, other GI or GU symptoms or other general symptoms. She called the office and was told to come here to MAU for evaluation.    Past Medical History:  Diagnosis Date  . Anxiety   . Depression   . Dermoid cyst    per pt, left ovary  . Migraines   . Obesity   . Polycystic ovarian disease   . Seasonal allergies   . Vaginal Pap smear, abnormal    OB History  Gravida Para Term Preterm AB Living  1 1   1   2   SAB TAB Ectopic Multiple Live Births        1 2    # Outcome Date GA Lbr Len/2nd Weight Sex Delivery Anes PTL Lv  1A Preterm 01/04/18 [redacted]w[redacted]d  1660 g M CS-LTranv Spinal  LIV  1B Preterm 01/04/18 [redacted]w[redacted]d  1770 g M CS-LTranv Spinal  LIV     Birth Comments: preterm   Past Surgical History:  Procedure Laterality Date  . ADENOIDECTOMY    . CESAREAN SECTION MULTI-GESTATIONAL N/A 01/04/2018   Procedure: CESAREAN SECTION MULTI-GESTATIONAL;  Surgeon: Allyn Kenner, DO;  Location: [place];  Service: Obstetrics;  Laterality: N/A;  . TONSILLECTOMY    . UNILATERAL SALPINGECTOMY Left 01/04/2018   Procedure: UNILATERAL SALPINGECTOMY;  Surgeon: Allyn Kenner, DO;  Location: [place];  Service: Obstetrics;  Laterality: Left;   Social History   Socioeconomic History  . Marital status: Single    Spouse name: Not on file  . Number of children: Not on file  . Years of  education: Not on file  . Highest education level: Not on file  Occupational History  . Not on file  Social Needs  . Financial resource strain: Not on file  . Food insecurity:    Worry: Not on file    Inability: Not on file  . Transportation needs:    Medical: Not on file    Non-medical: Not on file  Tobacco Use  . Smoking status: Former Smoker    Packs/day: 0.25    Types: Cigarettes    Last attempt to quit: 06/01/2017    Years since quitting: 0.6  . Smokeless tobacco: Never Used  . Tobacco comment: stopped with pregnancy  Substance and Sexual Activity  . Alcohol use: Not Currently  . Drug use: No  . Sexual activity: Yes    Birth control/protection: None  Lifestyle  . Physical activity:    Days per week: Not on file    Minutes per session: Not on file  . Stress: Not on file  Relationships  . Social connections:    Talks on phone: Not on file    Gets together: Not on file    Attends religious service: Not on file    Active member of club or organization: Not on file    Attends meetings of clubs or organizations: Not on file    Relationship status: Not on file  .  Intimate partner violence:    Fear of current or ex partner: Not on file    Emotionally abused: Not on file    Physically abused: Not on file    Forced sexual activity: Not on file  Other Topics Concern  . Not on file  Social History Narrative  . Not on file   No current facility-administered medications on file prior to encounter.    Current Outpatient Medications on File Prior to Encounter  Medication Sig Dispense Refill  . albuterol (PROVENTIL HFA;VENTOLIN HFA) 108 (90 Base) MCG/ACT inhaler Inhale 2 puffs into the lungs every 4 (four) hours as needed for wheezing or shortness of breath (cough, shortness of breath or wheezing.). 1 Inhaler 11  . ibuprofen (ADVIL,MOTRIN) 600 MG tablet Take 1 tablet (600 mg total) by mouth every 6 (six) hours. 30 tablet 0  . NIFEdipine (ADALAT CC) 30 MG 24 hr tablet Take 1  tablet (30 mg total) by mouth daily. 30 tablet 0  . Prenatal Vit-Fe Fumarate-FA (PRENATAL MULTIVITAMIN) TABS tablet Take 1 tablet by mouth daily at 12 noon.     Allergies  Allergen Reactions  . Doxycycline Nausea And Vomiting    (patient notes that only the tablets have caused this)    ROS: Pertinent items in HPI  OBJECTIVE BP (!) 142/81 (BP Location: Right Arm)   Pulse 78   Temp 99.2 F (37.3 C) (Oral)   Resp 18   Wt (!) 152 kg   SpO2 99%   Breastfeeding? Yes Comment: pumping, NICU  BMI 50.94 kg/m  CONSTITUTIONAL: Well-developed, well-nourished female in no acute distress.  HENT:  Normocephalic, atraumatic, External right and left ear normal. Oropharynx is clear and moist EYES: Conjunctivae and EOM are normal. Pupils are equal, round, and reactive to light. No scleral icterus.  NECK: Normal range of motion, supple, no masses.  Normal thyroid.  SKIN: Skin is warm and dry. No rash noted. Not diaphoretic. No erythema. No pallor. [place]: Alert and oriented to person, place, and time. Normal reflexes, muscle tone coordination. No cranial nerve deficit noted. PSYCHIATRIC: Normal mood and affect. Normal behavior. Normal judgment and thought content. CARDIOVASCULAR: Normal heart rate noted RESPIRATORY: Effort and breath sounds normal, no problems with respiration noted. PELVIC: Deferred MUSCULOSKELETAL: Normal range of motion. No tenderness.  No cyanosis, clubbing, or edema.  2+ distal pulses. ABDOMEN: Soft, normal bowel sounds, no distention noted.  No tenderness, rebound or guarding. Incision is healing well, no erythema, Small collection palpated in subcutaneous region under the right side of the incision, noted to be spontaneously draining dark, bloody fluid from a 3 mm defect on the incision.  Expressed this collection and more fluid was extruded, was able to decompress this collection until I could not palpate it anymore. Also "milked" the rest of the incision towards the tiny  defect, minimal bloody drainage noted. No purulent drainage, no incisional induration noted. Patient was able to tolerate the entire exam with minimal pain.   MAU COURSE Expressed subcutaneous hematoma as mentioned above without complications.  ASSESSMENT 1. Hematoma of cesarean section or perineal wound    PLAN Patient was told to follow up on 01/16/18 in office for recheck. I called Dr. Ouida Sills (covering OB today) to ask if they usually do any further studies or imaging in this scenario, and he said not to do any evaluation given that she was stable, and she will follow up as per my plan. Patient told to return for any worsening or concerning symptoms.  Discharge  home Follow-up Information    Ob/Gyn, [facility] Follow up on 01/16/2018.   Why:  Wound recheck Contact information: [place] [address] [phone]          Allergies as of 01/12/2018      Reactions   Doxycycline Nausea And Vomiting   (patient notes that only the tablets have caused this)      Medication List    TAKE these medications   albuterol 108 (90 Base) MCG/ACT inhaler Commonly known as:  PROVENTIL HFA;VENTOLIN HFA Inhale 2 puffs into the lungs every 4 (four) hours as needed for wheezing or shortness of breath (cough, shortness of breath or wheezing.).   cefadroxil 500 MG capsule Commonly known as:  DURICEF Take 1 capsule (500 mg total) by mouth 2 (two) times daily.   ibuprofen 600 MG tablet Commonly known as:  ADVIL,MOTRIN Take 1 tablet (600 mg total) by mouth every 6 (six) hours.   NIFEdipine 30 MG 24 hr tablet Commonly known as:  ADALAT CC Take 1 tablet (30 mg total) by mouth daily.   prenatal multivitamin Tabs tablet Take 1 tablet by mouth daily at 12 noon.        Osborne Oman, MD 01/13/2018 11:56 AM

## 2018-01-12 NOTE — Discharge Instructions (Signed)

## 2018-01-17 DIAGNOSIS — Z98891 History of uterine scar from previous surgery: Secondary | ICD-10-CM | POA: Insufficient documentation

## 2018-04-09 NOTE — Progress Notes (Deleted)
Cardiology Office Note    Date:  04/09/2018   ID:  Miranda Stewart, DOB Dec 01, 1985, MRN [id number]  PCP:  Carol Ada, MD  Cardiologist:  Fransico Him, MD   No chief complaint on file.   History of Present Illness:  Miranda Stewart is a 33 y.o. female who is being seen today for the evaluation of *** at the request of Allyn Kenner, DO.    Past Medical History:  Diagnosis Date  . Anxiety   . Depression   . Dermoid cyst    per pt, left ovary  . Migraines   . Obesity   . Polycystic ovarian disease   . Seasonal allergies   . Vaginal Pap smear, abnormal     Past Surgical History:  Procedure Laterality Date  . ADENOIDECTOMY    . CESAREAN SECTION MULTI-GESTATIONAL N/A 01/04/2018   Procedure: CESAREAN SECTION MULTI-GESTATIONAL;  Surgeon: Allyn Kenner, DO;  Location: [place];  Service: Obstetrics;  Laterality: N/A;  . TONSILLECTOMY    . UNILATERAL SALPINGECTOMY Left 01/04/2018   Procedure: UNILATERAL SALPINGECTOMY;  Surgeon: Allyn Kenner, DO;  Location: [place];  Service: Obstetrics;  Laterality: Left;    Current Medications: No outpatient medications have been marked as taking for the 04/10/18 encounter (Appointment) with Sueanne Margarita, MD.    Allergies:   Doxycycline   Social History   Socioeconomic History  . Marital status: Single    Spouse name: Not on file  . Number of children: Not on file  . Years of education: Not on file  . Highest education level: Not on file  Occupational History  . Not on file  Social Needs  . Financial resource strain: Not on file  . Food insecurity:    Worry: Not on file    Inability: Not on file  . Transportation needs:    Medical: Not on file    Non-medical: Not on file  Tobacco Use  . Smoking status: Former Smoker    Packs/day: 0.25    Types: Cigarettes    Last attempt to quit: 06/01/2017    Years since quitting: 0.8  . Smokeless tobacco: Never Used  . Tobacco comment: stopped  with pregnancy  Substance and Sexual Activity  . Alcohol use: Not Currently  . Drug use: No  . Sexual activity: Yes    Birth control/protection: None  Lifestyle  . Physical activity:    Days per week: Not on file    Minutes per session: Not on file  . Stress: Not on file  Relationships  . Social connections:    Talks on phone: Not on file    Gets together: Not on file    Attends religious service: Not on file    Active member of club or organization: Not on file    Attends meetings of clubs or organizations: Not on file    Relationship status: Not on file  Other Topics Concern  . Not on file  Social History Narrative  . Not on file     Family History:  The patient's ***family history is not on file.   ROS:   Please see the history of present illness.    ROS All other systems reviewed and are negative.  No flowsheet data found.     PHYSICAL EXAM:   VS:  There were no vitals taken for this visit.   GEN: Well nourished, well developed, in no acute distress  HEENT: normal  Neck: no JVD, carotid bruits,  or masses Cardiac: ***RRR; no murmurs, rubs, or gallops,no edema.  Intact distal pulses bilaterally.  Respiratory:  clear to auscultation bilaterally, normal work of breathing GI: soft, nontender, nondistended, + BS MS: no deformity or atrophy  Skin: warm and dry, no rash Neuro:  Alert and Oriented x 3, Strength and sensation are intact Psych: euthymic mood, full affect  Wt Readings from Last 3 Encounters:  01/12/18 (!) 335 lb (152 kg)  01/06/18 (!) 343 lb 14.7 oz (156 kg)  01/04/18 (!) 344 lb (156 kg)      Studies/Labs Reviewed:   EKG:  EKG is*** ordered today.  The ekg ordered today demonstrates ***  Recent Labs: 01/06/2018: ALT 34; BUN 10; Creatinine, Ser 0.84; Hemoglobin 11.8; Platelets 294; Potassium 3.9; Sodium 134   Lipid Panel No results found for: CHOL, TRIG, HDL, CHOLHDL, VLDL, LDLCALC, LDLDIRECT  Additional studies/ records that were reviewed  today include:  ***    ASSESSMENT:    No diagnosis found.   PLAN:  In order of problems listed above:  1. ***    Medication Adjustments/Labs and Tests Ordered: Current medicines are reviewed at length with the patient today.  Concerns regarding medicines are outlined above.  Medication changes, Labs and Tests ordered today are listed in the Patient Instructions below.  There are no Patient Instructions on file for this visit.   Signed, Fransico Him, MD  04/09/2018 5:08 PM    [address], [place], [place]  [address] Phone: [phone]; Fax: [phone]

## 2018-04-10 ENCOUNTER — Ambulatory Visit: Payer: 59 | Admitting: Cardiology

## 2018-04-11 ENCOUNTER — Encounter: Payer: Self-pay | Admitting: Cardiology

## 2018-04-25 ENCOUNTER — Encounter: Payer: Self-pay | Admitting: Cardiology

## 2018-05-09 ENCOUNTER — Telehealth: Payer: Self-pay | Admitting: Cardiology

## 2018-05-09 NOTE — Telephone Encounter (Addendum)
Patient called to possibly reschedule appointment due to coronavirus outbreak.  Patient had pregnancy related hypertension back in the fall and delivered in mid November.  Prior to her delivery she had been referred to cardiology but had not been seen.  I called the patient today and she is not having any problems.  She is fine with being rescheduled.  Please call patient to reschedule in about 2-3 months

## 2018-05-10 ENCOUNTER — Ambulatory Visit: Payer: 59 | Admitting: Cardiology

## 2018-05-29 ENCOUNTER — Telehealth: Payer: Self-pay

## 2018-05-29 NOTE — Telephone Encounter (Signed)
TELEPHONE CALL NOTE  This patient has been deemed a candidate for follow-up tele-health visit to limit community exposure during the Covid-19 pandemic. I spoke with the patient via phone to discuss instructions. This has been outlined on the patient's AVS (dotphrase: hcevisitinfo). The patient was advised to review the section on consent for treatment as well. The patient will receive a phone call 2-3 days prior to their E-Visit at which time consent will be verbally confirmed. A Virtual Office Visit appointment type has been scheduled for 2:30 pm on 05/30/2018 with Dr Radford Pax, with "VIDEO" or "TELEPHONE" in the appointment notes - patient prefers Video type.  I have either confirmed the patient is active in MyChart or offered to send sign-up link to phone/email via Mychart icon beside patient's photo.Not Active  Miranda Stewart, [facility] 05/29/2018 4:25 PM     Virtual Visit Pre-Appointment Phone Call  TELEPHONE CALL NOTE  Miranda Stewart has been deemed a candidate for a follow-up tele-health visit to limit community exposure during the Covid-19 pandemic. I spoke with the patient via phone to ensure availability of phone/video source, confirm preferred email & phone number, and discuss instructions and expectations.  I reminded Miranda Stewart to be prepared with any vital sign and/or heart rhythm information that could potentially be obtained via home monitoring, at the time of her visit. I reminded Miranda Stewart to expect a phone call at the time of her visit if her visit.  Did the patient verbally acknowledge consent to treatment? Yes  Miranda Stewart, [place] 05/29/2018 4:27 PM   DOWNLOADING THE [place], go to [employer] and type in WebEx in the search bar. [name], the blue/green circle. The app is free but as with any other app downloads, their phone may require them to verify saved payment information or Apple password. The patient does NOT  have to create an account.  - If Android, ask patient to go to [employer] and type in WebEx in the search bar. [name], the blue/green circle. The app is free but as with any other app downloads, their phone may require them to verify saved payment information or Android password. The patient does NOT have to create an account.   CONSENT FOR TELE-HEALTH VISIT - PLEASE REVIEW  I hereby voluntarily request, consent and authorize [facility] and its employed or contracted physicians, physician assistants, nurse practitioners or other licensed health care professionals (the Practitioner), to provide me with telemedicine health care services (the Services") as deemed necessary by the treating Practitioner. I acknowledge and consent to receive the Services by the Practitioner via telemedicine. I understand that the telemedicine visit will involve communicating with the Practitioner through live audiovisual communication technology and the disclosure of certain medical information by electronic transmission. I acknowledge that I have been given the opportunity to request an in-person assessment or other available alternative prior to the telemedicine visit and am voluntarily participating in the telemedicine visit.  I understand that I have the right to withhold or withdraw my consent to the use of telemedicine in the course of my care at any time, without affecting my right to future care or treatment, and that the Practitioner or I may terminate the telemedicine visit at any time. I understand that I have the right to inspect all information obtained and/or recorded in the course of the telemedicine visit and may receive copies of available information for a reasonable  fee.  I understand that some of the potential risks of receiving the Services via telemedicine include:   Delay or interruption in medical evaluation due to technological equipment failure or  disruption;  Information transmitted may not be sufficient (e.g. poor resolution of images) to allow for appropriate medical decision making by the Practitioner; and/or   In rare instances, security protocols could fail, causing a breach of personal health information.  Furthermore, I acknowledge that it is my responsibility to provide information about my medical history, conditions and care that is complete and accurate to the best of my ability. I acknowledge that Practitioner's advice, recommendations, and/or decision may be based on factors not within their control, such as incomplete or inaccurate data provided by me or distortions of diagnostic images or specimens that may result from electronic transmissions. I understand that the practice of medicine is not an exact science and that Practitioner makes no warranties or guarantees regarding treatment outcomes. I acknowledge that I will receive a copy of this consent concurrently upon execution via email to the email address I last provided but may also request a printed copy by calling the office of [facility].    I understand that my insurance will be billed for this visit.   I have read or had this consent read to me.  I understand the contents of this consent, which adequately explains the benefits and risks of the Services being provided via telemedicine.   I have been provided ample opportunity to ask questions regarding this consent and the Services and have had my questions answered to my satisfaction.  I give my informed consent for the services to be provided through the use of telemedicine in my medical care  By participating in this telemedicine visit I agree to the above.

## 2018-05-30 ENCOUNTER — Encounter: Payer: Self-pay | Admitting: Cardiology

## 2018-05-30 ENCOUNTER — Other Ambulatory Visit: Payer: Self-pay

## 2018-05-30 ENCOUNTER — Telehealth (INDEPENDENT_AMBULATORY_CARE_PROVIDER_SITE_OTHER): Payer: 59 | Admitting: Cardiology

## 2018-05-30 DIAGNOSIS — I10 Essential (primary) hypertension: Secondary | ICD-10-CM | POA: Diagnosis not present

## 2018-05-30 DIAGNOSIS — Z8249 Family history of ischemic heart disease and other diseases of the circulatory system: Secondary | ICD-10-CM | POA: Insufficient documentation

## 2018-05-30 HISTORY — DX: Family history of ischemic heart disease and other diseases of the circulatory system: Z82.49

## 2018-05-30 MED ORDER — METOPROLOL SUCCINATE ER 25 MG PO TB24: 25.0000 mg | ORAL_TABLET | Freq: Every day | ORAL | 3 refills | Status: DC

## 2018-05-30 NOTE — Patient Instructions (Signed)
Medication Instructions:  Start: Toprol 25 mg, daily, by mouth  If you need a refill on your cardiac medications before your next appointment, please call your pharmacy.   Lab work: None If you have labs (blood work) drawn today and your tests are completely normal, you will receive your results only by: Marland Kitchen MyChart Message (if you have MyChart) OR . A paper copy in the mail If you have any lab test that is abnormal or we need to change your treatment, we will call you to review the results.  Testing/Procedures: Your physician has requested that you have an echocardiogram around 07/24/18. Echocardiography is a painless test that uses sound waves to create images of your heart. It provides your doctor with information about the size and shape of your heart and how well your heart's chambers and valves are working. This procedure takes approximately one hour. There are no restrictions for this procedure.  Follow-Up: Keep your scheduled follow up on 06/06/18.

## 2018-05-30 NOTE — Progress Notes (Signed)
Virtual Visit via Video Note   This visit type was conducted due to national recommendations for restrictions regarding the COVID-19 Pandemic (e.g. social distancing) in an effort to limit this patient's exposure and mitigate transmission in our community.  Due to her co-morbid illnesses, this patient is at least at moderate risk for complications without adequate follow up.  This format is felt to be most appropriate for this patient at this time.  All issues noted in this document were discussed and addressed.  A limited physical exam was performed with this format.  Please refer to the patient's chart for her consent to telehealth for [facility].  Evaluation Performed:  Follow-up visit  This visit type was conducted due to national recommendations for restrictions regarding the COVID-19 Pandemic (e.g. social distancing).  This format is felt to be most appropriate for this patient at this time.  All issues noted in this document were discussed and addressed.  No physical exam was performed (except for noted visual exam findings with Video Visits).  Please refer to the patient's chart (MyChart message for video visits and phone note for telephone visits) for the patient's consent to telehealth for [facility].  Date:  05/30/2018   ID:  Miranda Stewart, DOB [birth year], MRN [id number]  Patient Location:  [place]  Provider location:   [place]  PCP:  Carol Ada, MD  Cardiologist:  Fransico Him, MD Electrophysiologist:  None   Chief Complaint:  Pregnancy induced HTN  History of Present Illness:    Miranda Stewart is a 33 y.o. female who presents via audio/video conferencing for a telehealth visit today.    This is a very pleasant 34 year old obese African-American female with a history of anxiety and depression, migraine headaches, PCO and chronic hypertension.  She had been on metformin for her PCOS to help her get placed currently not on that.  She tells me that she has had  chronic hypertension with blood pressures sometimes normal and sometimes elevated.  She is currently not on any antihypertensive medications.  She had been on nifedipine during pregnancy.  She was pregnant with twins back in the fall  and was admitted on 01/04/2018 where blood pressure was 156/95 mmHg.  She successfully delivered by primary C-section twin boys due to issues with the HR with on of the twins.  She is now referred for evaluation of her hypertension. The only EKG I can find is from 06/23/2016 which showed sinus bradycardia and short PR interval.  There was also early repolarization.  She denies any chest pain or pressure, SOB, DOE, PND, orthopnea, LE edema, dizziness, palpitations or syncope. She is compliant with her meds and is tolerating meds with no SE.     The patient does not have symptoms concerning for COVID-19 infection (fever, chills, cough, or new shortness of breath).    Prior CV studies:   The following studies were reviewed today:  EKG from 2018 and hospital notes from [place]  Past Medical History:  Diagnosis Date   Anxiety    Benign essential HTN    Depression    Dermoid cyst    per pt, left ovary   Family history of cardiomyopathy 05/30/2018   Migraines    Obesity    Polycystic ovarian disease    Postpartum state    Seasonal allergies    Vaginal Pap smear, abnormal    Past Surgical History:  Procedure Laterality Date   ADENOIDECTOMY     CESAREAN SECTION MULTI-GESTATIONAL N/A 01/04/2018  Procedure: CESAREAN SECTION MULTI-GESTATIONAL;  Surgeon: Allyn Kenner, DO;  Location: [place];  Service: Obstetrics;  Laterality: N/A;   TONSILLECTOMY     UNILATERAL SALPINGECTOMY Left 01/04/2018   Procedure: UNILATERAL SALPINGECTOMY;  Surgeon: Allyn Kenner, DO;  Location: [place];  Service: Obstetrics;  Laterality: Left;     Current Meds  Medication Sig   norethindrone (MICRONOR,CAMILA,ERRIN) 0.35 MG tablet Take 1 tablet  by mouth daily.     Allergies:   Doxycycline   Social History   Tobacco Use   Smoking status: Former Smoker    Packs/day: 0.25    Types: Cigarettes    Last attempt to quit: 06/01/2017    Years since quitting: 0.9   Smokeless tobacco: Never Used   Tobacco comment: stopped with pregnancy  Substance Use Topics   Alcohol use: Not Currently   Drug use: No     Family Hx: The patient's family history includes Diabetes Mellitus I in her maternal grandmother and paternal grandmother; Heart disease in her mother.  ROS:   Please see the history of present illness.     All other systems reviewed and are negative.   Labs/Other Tests and Data Reviewed:    Recent Labs: 01/06/2018: ALT 34; BUN 10; Creatinine, Ser 0.84; Hemoglobin 11.8; Platelets 294; Potassium 3.9; Sodium 134   Recent Lipid Panel No results found for: CHOL, TRIG, HDL, CHOLHDL, LDLCALC, LDLDIRECT  Wt Readings from Last 3 Encounters:  05/30/18 (!) 330 lb (149.7 kg)  01/12/18 (!) 335 lb (152 kg)  01/06/18 (!) 343 lb 14.7 oz (156 kg)     Objective:    Vital Signs:  BP (!) 147/95    Pulse (!) 106    Ht 5\' 8"  (1.727 m)    Wt (!) 330 lb (149.7 kg) Comment: per pt   BMI 50.18 kg/m    Well nourished, well developed female in no acute distress. Well appearing, alert and conversant, regular work of breathing,  good skin color  Eyes- anicteric mouth- oral mucosa is pink  neuro- grossly intact skin- no apparent rash or lesions or cyanosis   ASSESSMENT & PLAN:     1.  Chronic essential hypertension -she has had chronic hypertension off and on for several years the patient reported pregnancy with the pain but has not been on that since her delivery.  Her blood pressure is elevated on exam today.  Her heart rate is also elevated so I recommended starting her on Toprol-XL 25 mg daily.  I have asked her to check her blood pressure 2 hours after taking her medicine daily for a week and keep a record of them in a journal.   I will then do a video c WebEx in 1 week to see if her blood pressures have improved.  2.  Family history of dilated cardiomyopathy  - she will either maternal really myocardiopathy and then her mother was found dead at age 28 and presumed secondary to ischemic heart disease although she has never had a heart attack or CAD documented.  Need to consider possible familial cardiomyopathy with sudden death.  The patient denies any history of dizziness, presyncope or syncope.  I will get a 2D echocardiogram to assess LV function and LV size and rule out structural heart disease.  Due to the Covid crisis we will get this in June.  COVID-19 Education: The signs and symptoms of COVID-19 were discussed with the patient and how to seek care for testing (follow up with PCP  or arrange E-visit).  The importance of social distancing was discussed today.  Patient Risk:   After full review of this patient's clinical status, I feel that they are at least moderate risk at this time.  Time:   Today, I have spent 45 minutes with the patient reviewing chart and discussing medical problems including problems with high BP and family hx of DCM and reviewing symptoms of COVID 19 and the ways to protect against contracting the virus with telehealth technology.      Medication Adjustments/Labs and Tests Ordered: Current medicines are reviewed at length with the patient today.  Concerns regarding medicines are outlined above.  Tests Ordered: No orders of the defined types were placed in this encounter.  Medication Changes: No orders of the defined types were placed in this encounter.   Disposition:  Follow up in 1 week(s)  Signed, Fransico Him, MD  05/30/2018 2:34 PM    [place]

## 2018-06-06 ENCOUNTER — Telehealth: Payer: Self-pay

## 2018-06-06 ENCOUNTER — Telehealth: Payer: 59 | Admitting: Cardiology

## 2018-06-06 ENCOUNTER — Other Ambulatory Visit: Payer: Self-pay

## 2018-06-06 NOTE — Telephone Encounter (Signed)
Called and spoke with patient about her virtual video appointment this morning and she stated she had not started her new medication (metoprolol) yet. Patient was told to pick up medication from pharmacy and after she starts it to call office to reschedule virtual visit. She stated her understanding and that she will call to reschedule.

## 2018-07-25 ENCOUNTER — Ambulatory Visit: Payer: 59 | Admitting: Cardiology

## 2018-07-25 ENCOUNTER — Other Ambulatory Visit (HOSPITAL_COMMUNITY): Payer: 59

## 2018-08-15 ENCOUNTER — Encounter (HOSPITAL_COMMUNITY): Payer: Self-pay | Admitting: Cardiology

## 2019-03-30 ENCOUNTER — Other Ambulatory Visit: Payer: Self-pay

## 2019-03-30 ENCOUNTER — Ambulatory Visit (HOSPITAL_COMMUNITY)
Admission: EM | Admit: 2019-03-30 | Discharge: 2019-03-30 | Disposition: A | Discharge status: Home / Self Care | Payer: 59 | Attending: Family Medicine | Admitting: Family Medicine

## 2019-03-30 ENCOUNTER — Encounter (HOSPITAL_COMMUNITY): Payer: Self-pay

## 2019-03-30 DIAGNOSIS — S0502XA Injury of conjunctiva and corneal abrasion without foreign body, left eye, initial encounter: Secondary | ICD-10-CM

## 2019-03-30 MED ORDER — FLUORESCEIN SODIUM 1 MG OP STRP
1.0000 | ORAL_STRIP | Freq: Once | OPHTHALMIC | Status: AC
  Administered 2019-03-30: 1 via OPHTHALMIC

## 2019-03-30 MED ORDER — TETRACAINE HCL 0.5 % OP SOLN
OPHTHALMIC | Status: AC
  Filled 2019-03-30: qty 4

## 2019-03-30 MED ORDER — FLUORESCEIN SODIUM 1 MG OP STRP
ORAL_STRIP | OPHTHALMIC | Status: AC
  Filled 2019-03-30: qty 1

## 2019-03-30 MED ORDER — ERYTHROMYCIN 5 MG/GM OP OINT: 1.0000 "application " | TOPICAL_OINTMENT | Freq: Four times a day (QID) | OPHTHALMIC | 0 refills | Status: AC

## 2019-03-30 MED ORDER — TETRACAINE HCL 0.5 % OP SOLN
2.0000 [drp] | Freq: Once | OPHTHALMIC | Status: AC
  Administered 2019-03-30: 2 [drp] via OPHTHALMIC

## 2019-03-30 NOTE — ED Notes (Signed)
Visual acuity screening performed, pt wears glasses baseline but did not bring them today. Pt's left eye (injured) has significantly better vision than her non-inflamed right eye.

## 2019-03-30 NOTE — Discharge Instructions (Signed)
Cool compresses as needed for comfort for swelling.  Use of eye ointment as prescribed for your abrasion.  Please follow up with your eye doctor for recheck early next week. I have listed our on call eye doctor if you need or prefer. Please return for any worsening of symptoms- vision change or loss, increased pain- or otherwise worsening

## 2019-03-30 NOTE — ED Provider Notes (Signed)
[place]    CSN: [id number] Arrival date & time: 03/30/19  1134      History   Chief Complaint Chief Complaint  Patient presents with  . Conjunctivitis    HPI Miranda Stewart is a 34 y.o. female.   Miranda Stewart presents with complaints of left eye redness, tearing and swelling which started last night after her baby accidentally scratched her eye. She rubbed her eye after and pain significantly worsened. This morning she woke and noted swelling and has had clear tearing. It feels irritated, burning, and with a foreign body sensation with  Blinking. No vision changes. She did flush out her eye last night with water. She wears glasses but no contacts.    ROS per HPI, negative if not otherwise mentioned.      Past Medical History:  Diagnosis Date  . Anxiety   . Benign essential HTN   . Depression   . Dermoid cyst    per pt, left ovary  . Family history of cardiomyopathy 05/30/2018  . Migraines   . Obesity   . Polycystic ovarian disease   . Postpartum state   . Seasonal allergies   . Vaginal Pap smear, abnormal     Patient Active Problem List   Diagnosis Date Noted  . Benign essential hypertension 05/30/2018  . Family history of cardiomyopathy 05/30/2018  . Benign essential HTN   . Cesarean delivery delivered 01/04/2018    Past Surgical History:  Procedure Laterality Date  . ADENOIDECTOMY    . CESAREAN SECTION MULTI-GESTATIONAL N/A 01/04/2018   Procedure: CESAREAN SECTION MULTI-GESTATIONAL;  Surgeon: Allyn Kenner, DO;  Location: [place];  Service: Obstetrics;  Laterality: N/A;  . TONSILLECTOMY    . UNILATERAL SALPINGECTOMY Left 01/04/2018   Procedure: UNILATERAL SALPINGECTOMY;  Surgeon: Allyn Kenner, DO;  Location: [place];  Service: Obstetrics;  Laterality: Left;    OB History    Gravida  1   Para  1   Term      Preterm  1   AB      Living  2     SAB      TAB      Ectopic      Multiple  1   Live Births  2            Home Medications    Prior to Admission medications   Medication Sig Start Date End Date Taking? Authorizing Provider  erythromycin ophthalmic ointment Place 1 application into the left eye 4 (four) times daily for 7 days. 03/30/19 04/06/19  Zigmund Gottron, NP  metoprolol succinate (TOPROL-XL) 25 MG 24 hr tablet Take 1 tablet (25 mg total) by mouth daily. 05/30/18 05/30/19  Sueanne Margarita, MD  norethindrone (MICRONOR,CAMILA,ERRIN) 0.35 MG tablet Take 1 tablet by mouth daily.    [provider]    Family History Family History  Problem Relation Age of Onset  . Heart disease Mother   . Diabetes Mellitus I Maternal Grandmother   . Diabetes Mellitus I Paternal Grandmother     Social History Social History   Tobacco Use  . Smoking status: Current Some Day Smoker    Packs/day: 0.25    Types: Cigarettes  . Smokeless tobacco: Never Used  . Tobacco comment: stopped with pregnancy  Substance Use Topics  . Alcohol use: Yes    Comment: occ  . Drug use: No     Allergies   Doxycycline   Review of Systems  Review of Systems   Physical Exam Triage Vital Signs ED Triage Vitals  Enc Vitals Group     BP 03/30/19 1228 (!) 153/106     Pulse Rate 03/30/19 1228 82     Resp 03/30/19 1228 17     Temp 03/30/19 1228 98.7 F (37.1 C)     Temp Source 03/30/19 1228 Oral     SpO2 03/30/19 1228 100 %     Weight --      Height --      Head Circumference --      Peak Flow --      Pain Score 03/30/19 1227 6     Pain Loc --      Pain Edu? --      Excl. in [place]? --    No data found.  Updated Vital Signs BP (S) (!) 153/106 (BP Location: Right Wrist)   Pulse 82   Temp 98.7 F (37.1 C) (Oral)   Resp 17   SpO2 100%   Visual Acuity Right Eye Distance: 20/200 Left Eye Distance: 20/40 Bilateral Distance:    Right Eye Near:   Left Eye Near:    Bilateral Near:     Physical Exam Constitutional:      General: She is not in acute distress.     Appearance: She is well-developed.  Eyes:     General: Vision grossly intact. Gaze aligned appropriately.        Left eye: No hordeolum.     Extraocular Movements: Extraocular movements intact.     Conjunctiva/sclera:     Left eye: Left conjunctiva is injected.     Pupils: Pupils are equal, round, and reactive to light.     Left eye: Corneal abrasion present.      Comments: Left eye with mild lid swelling noted at lash line; grossly injected; abrasion noted overlying pupil with woods lamp exam   Cardiovascular:     Rate and Rhythm: Normal rate.  Pulmonary:     Effort: Pulmonary effort is normal.  Skin:    General: Skin is warm and dry.  Neurological:     Mental Status: She is alert and oriented to person, place, and time.      UC Treatments / Results  Labs (all labs ordered are listed, but only abnormal results are displayed) Labs Reviewed - No data to display  EKG   Radiology No results found.  Procedures Procedures (including critical care time)  Medications Ordered in UC Medications  tetracaine (PONTOCAINE) 0.5 % ophthalmic solution 2 drop (has no administration in time range)  fluorescein ophthalmic strip 1 strip (1 strip Left Eye Given by Other 03/30/19 1305)    Initial Impression / Assessment and Plan / UC Course  I have reviewed the triage vital signs and the nursing notes.  Pertinent labs & imaging results that were available during my care of the patient were reviewed by me and considered in my medical decision making (see chart for details).     Pain significantly improved with tetracaine. Symptoms history and exam consistent with corneal abrasion with erythromycin ointment provided. Encouraged follow up with eye doctor for recheck. Return precautions provided. Patient verbalized understanding and agreeable to plan.   Final Clinical Impressions(s) / UC Diagnoses   Final diagnoses:  Abrasion of left cornea, initial encounter     Discharge Instructions      Cool compresses as needed for comfort for swelling.  Use of eye ointment as prescribed for your abrasion.  Please follow up with your eye doctor for recheck early next week. I have listed our on call eye doctor if you need or prefer. Please return for any worsening of symptoms- vision change or loss, increased pain- or otherwise worsening    ED Prescriptions    Medication Sig Dispense Auth. Provider   erythromycin ophthalmic ointment Place 1 application into the left eye 4 (four) times daily for 7 days. 28 g Zigmund Gottron, NP     PDMP not reviewed this encounter.   Zigmund Gottron, NP 03/30/19 1317

## 2019-03-30 NOTE — ED Triage Notes (Signed)
Patient presents to Urgent Care with complaints of left eye swelling and pain since yesterday. Patient reports she was playing with her son and his hand accidentally got in her eye, pt reports no vision changes, eye red and swollen upon inspection.

## 2019-12-29 IMAGING — US US MFM OB LIMITED
2 series · 6 of 6 positions shown · non-contrast
Comparison: none

[Series 2: us mfm ob limited · 4 of 4 slices shown (1 of 2)]
[im 1/4]
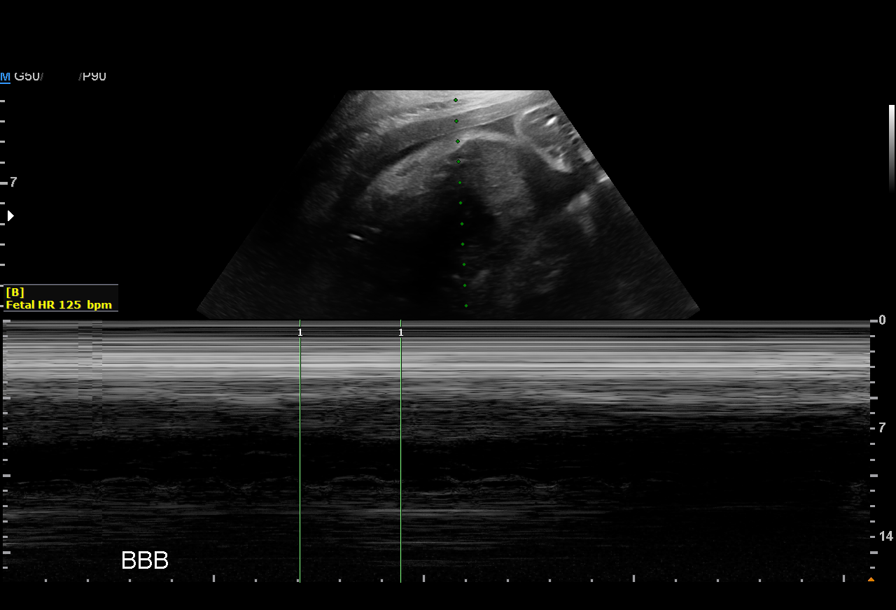
[im 2/4]
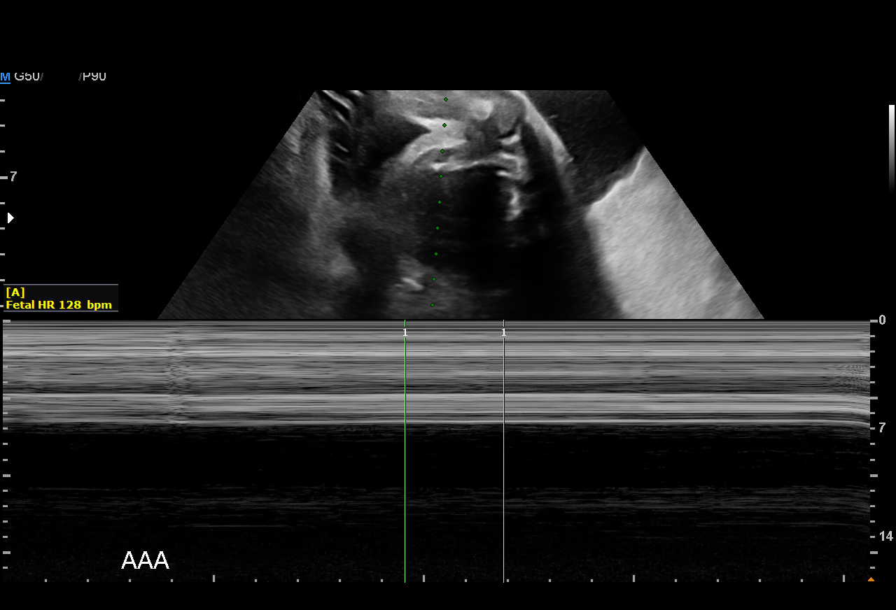
[im 3/4]
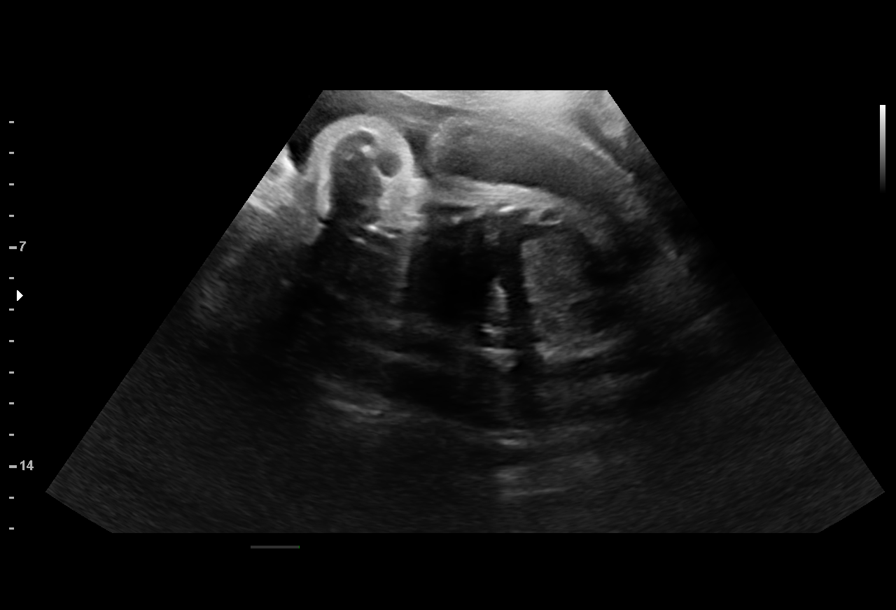
[im 4/4]
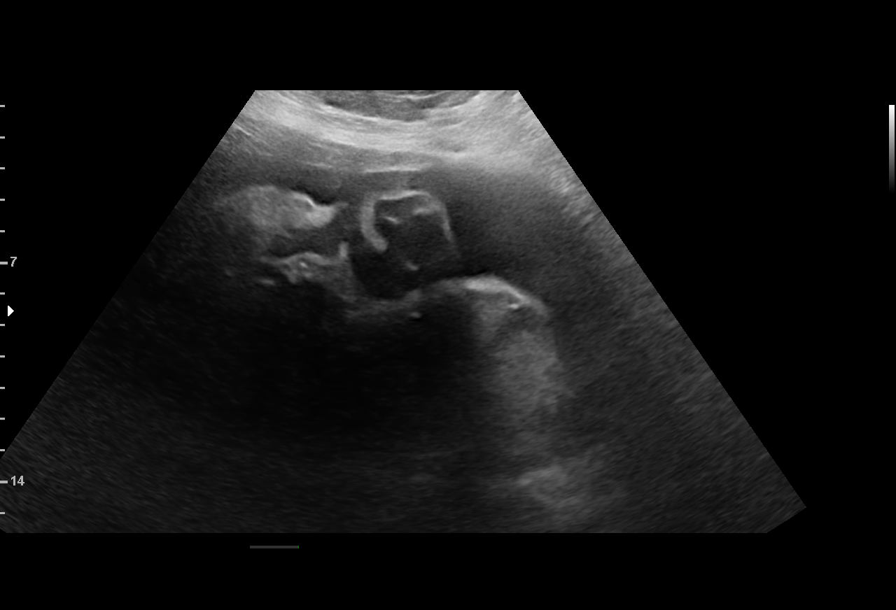

[Series 3: us mfm ob limited · 2 of 2 slices shown (2 of 2)]
[im 1/2]
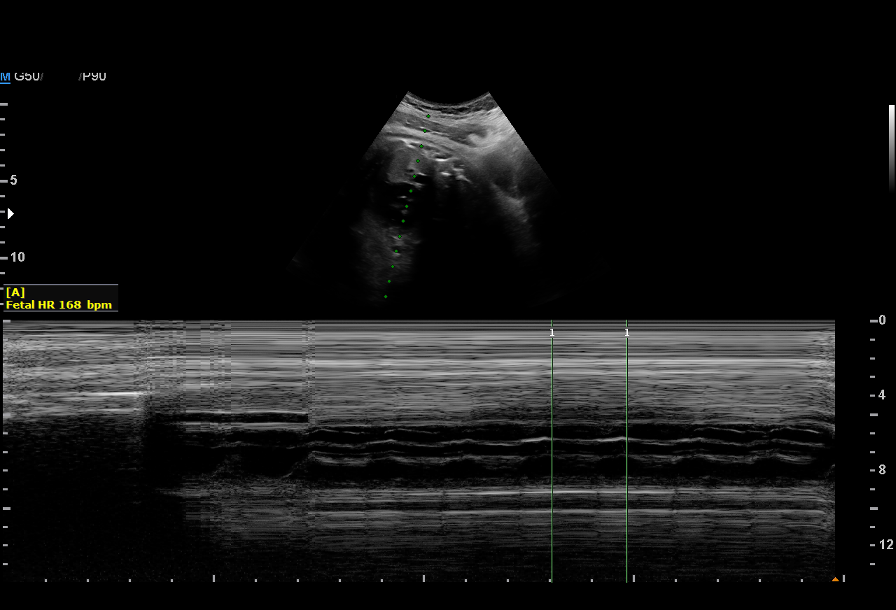
[im 2/2]
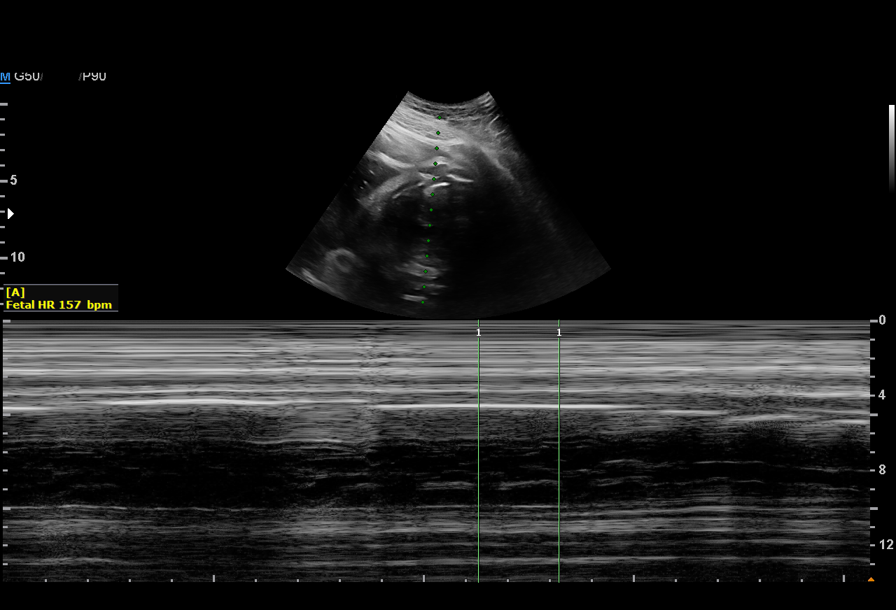

[6 of 6 positions shown; findings below may reference images not displayed]

1  US MFM OB LIMITED                    76815.01     YUNHWAN MEHARI
 ----------------------------------------------------------------------

 ----------------------------------------------------------------------
Indications

  33 weeks gestation of pregnancy
  Ovarian dermoid cyst complicating              O34.80,
  pregnancy, antepartum
  Maternal morbid obesity
  Obesity complicating pregnancy, third
  trimester
  Twin pregnancy, di/di, third trimester
 ----------------------------------------------------------------------
Vital Signs

                                                Height:        5'8"
Fetal Evaluation (Fetus A)

 Num Of Fetuses:         2
 Fetal Heart Rate(bpm):  128
 Cardiac Activity:       Observed
 Fetal Lie:              Maternal left side
 Presentation:           Breech
OB History

 Gravidity:    1
Gestational Age (Fetus A)

 Clinical EDD:  33w 2d                                        EDD:   02/20/18
 Best:          33w 2d     Det. By:  Clinical EDD             EDD:   02/20/18
Fetal Evaluation (Fetus B)
 Num Of Fetuses:         2
 Fetal Heart Rate(bpm):  125
 Cardiac Activity:       Observed
 Fetal Lie:              Maternal right side
 Presentation:           Transverse, head to maternal right
Gestational Age (Fetus B)

 Clinical EDD:  33w 2d                                        EDD:   02/20/18
 Best:          33w 2d     Det. By:  Clinical EDD             EDD:   02/20/18
Comments

 FH localized for both twins
Recommendations

 Recommendation for delivery already made due to BPP [DATE]
 for Deeqa Rayaan Adlaho
              Weinman, Daljit

## 2020-04-07 ENCOUNTER — Other Ambulatory Visit: Payer: Self-pay

## 2020-04-07 ENCOUNTER — Ambulatory Visit (HOSPITAL_COMMUNITY): Payer: Medicaid Other

## 2020-04-07 ENCOUNTER — Ambulatory Visit (HOSPITAL_COMMUNITY)
Admission: EM | Admit: 2020-04-07 | Discharge: 2020-04-07 | Disposition: A | Discharge status: Home / Self Care | Payer: Medicaid Other | Attending: Family Medicine | Admitting: Family Medicine

## 2020-04-07 ENCOUNTER — Encounter (HOSPITAL_COMMUNITY): Payer: Self-pay | Admitting: Emergency Medicine

## 2020-04-07 DIAGNOSIS — I10 Essential (primary) hypertension: Secondary | ICD-10-CM

## 2020-04-07 DIAGNOSIS — Z5321 Procedure and treatment not carried out due to patient leaving prior to being seen by health care provider: Secondary | ICD-10-CM

## 2020-04-07 DIAGNOSIS — R053 Chronic cough: Secondary | ICD-10-CM

## 2020-04-07 NOTE — ED Triage Notes (Signed)
Pt presents with cough and lingering chest congestion xs 2-3 weeks. States all the other symptoms have subsided.

## 2020-04-07 NOTE — ED Notes (Signed)
Pt left exam room before provider went in for evaluation. Tried contacting patient by phone with no answer.

## 2020-04-07 NOTE — ED Provider Notes (Signed)
  [place]   [id number] 04/07/20 Arrival Time: 7741  ASSESSMENT & PLAN:  1. Persistent cough for 3 weeks or longer   2. Elevated blood pressure reading in office with diagnosis of hypertension   3. Eloped from emergency department     Orders cancelled. Pt LWBS from room without notifying RN.  Orders Placed This Encounter  Procedures  . DG Chest 2 View    Standing Status:   Standing    Number of Occurrences:   1    Order Specific Question:   Reason for Exam (SYMPTOM  OR DIAGNOSIS REQUIRED)    Answer:   cough > 3 weeks   Clinical Course as of 04/07/20 0932  Mon Apr 07, 2020  0932 BP(!): 158/101 Noted. Pt LWBS after triage.  [BH]    Clinical Course User Index [BH] Vanessa Kick, MD       Vanessa Kick, MD 04/07/20 [phone]

## 2020-08-20 ENCOUNTER — Other Ambulatory Visit: Payer: Self-pay | Admitting: Family Medicine

## 2020-08-20 ENCOUNTER — Other Ambulatory Visit (HOSPITAL_COMMUNITY): Payer: Self-pay | Admitting: Family Medicine

## 2020-08-20 DIAGNOSIS — E049 Nontoxic goiter, unspecified: Secondary | ICD-10-CM

## 2020-08-20 DIAGNOSIS — E039 Hypothyroidism, unspecified: Secondary | ICD-10-CM

## 2021-05-06 LAB — HM PAP SMEAR

## 2021-05-16 ENCOUNTER — Encounter (HOSPITAL_COMMUNITY): Payer: Self-pay

## 2021-05-16 ENCOUNTER — Ambulatory Visit (HOSPITAL_COMMUNITY)
Admission: EM | Admit: 2021-05-16 | Discharge: 2021-05-16 | Disposition: A | Discharge status: Home / Self Care | Payer: Medicaid Other | Attending: Internal Medicine | Admitting: Internal Medicine

## 2021-05-16 ENCOUNTER — Other Ambulatory Visit: Payer: Self-pay

## 2021-05-16 DIAGNOSIS — L02415 Cutaneous abscess of right lower limb: Secondary | ICD-10-CM | POA: Diagnosis not present

## 2021-05-16 DIAGNOSIS — L02412 Cutaneous abscess of left axilla: Secondary | ICD-10-CM

## 2021-05-16 MED ORDER — SULFAMETHOXAZOLE-TRIMETHOPRIM 800-160 MG PO TABS: 1.0000 | ORAL_TABLET | Freq: Two times a day (BID) | ORAL | 0 refills | Status: AC

## 2021-05-16 NOTE — Discharge Instructions (Signed)
You are being treated with antibiotics.  Please continue warm compresses.  Follow-up if symptoms persist or worsen. ?

## 2021-05-16 NOTE — ED Provider Notes (Signed)
?[place] ? ? ? ?CSN: [id number] ?Arrival date & time: 05/16/21  1110 ? ? ?  ? ?History   ?Chief Complaint ?Chief Complaint  ?Patient presents with  ? Abscess  ?  Left arm and right leg  ? ? ?HPI ?Miranda Stewart is a 36 y.o. female.  ? ?Patient presents with left axilla abscess and right inner thigh abscess which both have been present for 3 to 4 days.  Patient reports that the thigh abscess has been draining on its own but the left axilla abscess has not been draining.  She reports history of axilla abscesses.  Denies fevers, body aches, chills. ? ? ?Abscess ? ?Past Medical History:  ?Diagnosis Date  ? Anxiety   ? Benign essential HTN   ? Depression   ? Dermoid cyst   ? per pt, left ovary  ? Family history of cardiomyopathy 05/30/2018  ? Migraines   ? Obesity   ? Polycystic ovarian disease   ? Postpartum state   ? Seasonal allergies   ? Vaginal Pap smear, abnormal   ? ? ?Patient Active Problem List  ? Diagnosis Date Noted  ? Benign essential hypertension 05/30/2018  ? Family history of cardiomyopathy 05/30/2018  ? Benign essential HTN   ? Cesarean delivery delivered 01/04/2018  ? ? ?Past Surgical History:  ?Procedure Laterality Date  ? ADENOIDECTOMY    ? CESAREAN SECTION MULTI-GESTATIONAL N/A 01/04/2018  ? Procedure: CESAREAN SECTION MULTI-GESTATIONAL;  Surgeon: Allyn Kenner, DO;  Location: [facility];  Service: Obstetrics;  Laterality: N/A;  ? TONSILLECTOMY    ? UNILATERAL SALPINGECTOMY Left 01/04/2018  ? Procedure: UNILATERAL SALPINGECTOMY;  Surgeon: Allyn Kenner, DO;  Location: [facility];  Service: Obstetrics;  Laterality: Left;  ? ? ?OB History   ? ? Gravida  ?1  ? Para  ?1  ? Term  ?   ? Preterm  ?1  ? AB  ?   ? Living  ?2  ?  ? ? SAB  ?   ? IAB  ?   ? Ectopic  ?   ? Multiple  ?1  ? Live Births  ?2  ?   ?  ?  ? ? ? ?Home Medications   ? ?Prior to Admission medications   ?Medication Sig Start Date End Date Taking? Authorizing Provider  ?sulfamethoxazole-trimethoprim (BACTRIM  DS) 800-160 MG tablet Take 1 tablet by mouth 2 (two) times daily for 7 days. 05/16/21 05/23/21 Yes Teodora Medici, FNP  ?metoprolol succinate (TOPROL-XL) 25 MG 24 hr tablet Take 1 tablet (25 mg total) by mouth daily. 05/30/18 05/30/19  Sueanne Margarita, MD  ?norethindrone (MICRONOR,CAMILA,ERRIN) 0.35 MG tablet Take 1 tablet by mouth daily.    [provider]  ? ? ?Family History ?Family History  ?Problem Relation Age of Onset  ? Heart disease Mother   ? Diabetes Mellitus I Maternal Grandmother   ? Diabetes Mellitus I Paternal Grandmother   ? ? ?Social History ?Social History  ? ?Tobacco Use  ? Smoking status: Some Days  ?  Packs/day: 0.25  ?  Types: Cigarettes  ? Smokeless tobacco: Never  ? Tobacco comments:  ?  stopped with pregnancy  ?Vaping Use  ? Vaping Use: Never used  ?Substance Use Topics  ? Alcohol use: Yes  ?  Comment: occ  ? Drug use: No  ? ? ? ?Allergies   ?Doxycycline ? ? ?Review of Systems ?Review of Systems ?Per HPI ? ?Physical Exam ?Triage Vital Signs ?ED  Triage Vitals  ?Enc Vitals Group  ?   BP 05/16/21 1308 (!) 149/93  ?   Pulse Rate 05/16/21 1308 90  ?   Resp 05/16/21 1308 20  ?   Temp 05/16/21 1308 98.6 ?F (37 ?C)  ?   Temp Source 05/16/21 1308 Oral  ?   SpO2 05/16/21 1308 96 %  ?   Weight --   ?   Height --   ?   Head Circumference --   ?   Peak Flow --   ?   Pain Score 05/16/21 1307 8  ?   Pain Loc --   ?   Pain Edu? --   ?   Excl. in [place]? --   ? ?No data found. ? ?Updated Vital Signs ?BP (!) 149/93 (BP Location: Right Arm)   Pulse 90   Temp 98.6 ?F (37 ?C) (Oral)   Resp 20   SpO2 96%   Breastfeeding No  ? ?Visual Acuity ?Right Eye Distance:   ?Left Eye Distance:   ?Bilateral Distance:   ? ?Right Eye Near:   ?Left Eye Near:    ?Bilateral Near:    ? ?Physical Exam ?Constitutional:   ?   General: She is not in acute distress. ?   Appearance: Normal appearance. She is not toxic-appearing or diaphoretic.  ?HENT:  ?   Head: Normocephalic and atraumatic.  ?Eyes:  ?   Extraocular Movements:  Extraocular movements intact.  ?   Conjunctiva/sclera: Conjunctivae normal.  ?Pulmonary:  ?   Effort: Pulmonary effort is normal.  ?Skin: ?   Findings: Abscess present.  ?   Comments: Approximately 3 cm x 1 cm indurated abscess present to left axilla.  No drainage noted.  No discoloration or erythema noted. ? ?Approximately 2.5 cm in diameter circular abscess present to right inner thigh that is currently draining purulent drainage.  It is indurated and not fluctuant.  It appears flat.  ?Neurological:  ?   General: No focal deficit present.  ?   Mental Status: She is alert and oriented to person, place, and time. Mental status is at baseline.  ?Psychiatric:     ?   Mood and Affect: Mood normal.     ?   Behavior: Behavior normal.     ?   Thought Content: Thought content normal.     ?   Judgment: Judgment normal.  ? ? ? ?UC Treatments / Results  ?Labs ?(all labs ordered are listed, but only abnormal results are displayed) ?Labs Reviewed - No data to display ? ?EKG ? ? ?Radiology ?No results found. ? ?Procedures ?Procedures (including critical care time) ? ?Medications Ordered in UC ?Medications - No data to display ? ?Initial Impression / Assessment and Plan / UC Course  ?I have reviewed the triage vital signs and the nursing notes. ? ?Pertinent labs & imaging results that were available during my care of the patient were reviewed by me and considered in my medical decision making (see chart for details). ? ?  ? ?Patient has two abscesses with one being to left axilla and one to right inner thigh.  Neither one of them are conducive to drainage at this time.  Will prescribe Bactrim to cover for infection as patient has doxycycline allergy.  Patient to follow-up if symptoms persist or worsen.  Discussed return precautions.  Patient to continue warm compresses.  Patient verbalized understanding and was agreeable with plan. ?Final Clinical Impressions(s) / UC Diagnoses  ? ?Final diagnoses:  ?  Abscess of left axilla   ?Abscess of right leg  ? ? ? ?Discharge Instructions   ? ?  ?You are being treated with antibiotics.  Please continue warm compresses.  Follow-up if symptoms persist or worsen. ? ? ? ?ED Prescriptions   ? ? Medication Sig Dispense Auth. Provider  ? sulfamethoxazole-trimethoprim (BACTRIM DS) 800-160 MG tablet Take 1 tablet by mouth 2 (two) times daily for 7 days. 14 tablet Oswaldo Conroy E, [place]  ? ?  ? ?PDMP not reviewed this encounter. ?  ?Teodora Medici, [place] ?05/16/21 1341 ? ?

## 2021-05-16 NOTE — ED Triage Notes (Signed)
Pt presents today with a 3-4 day h/o left axilla abscess. She also has an abscess on her right leg that has been there over a week and has started to drain.  ?

## 2021-06-25 ENCOUNTER — Ambulatory Visit (INDEPENDENT_AMBULATORY_CARE_PROVIDER_SITE_OTHER): Payer: Medicaid Other | Admitting: Internal Medicine

## 2021-06-25 ENCOUNTER — Encounter: Payer: Self-pay | Admitting: Internal Medicine

## 2021-06-25 DIAGNOSIS — I10 Essential (primary) hypertension: Secondary | ICD-10-CM | POA: Diagnosis not present

## 2021-06-25 DIAGNOSIS — R7303 Prediabetes: Secondary | ICD-10-CM | POA: Diagnosis not present

## 2021-06-25 DIAGNOSIS — E559 Vitamin D deficiency, unspecified: Secondary | ICD-10-CM

## 2021-06-25 DIAGNOSIS — I119 Hypertensive heart disease without heart failure: Secondary | ICD-10-CM | POA: Insufficient documentation

## 2021-06-25 DIAGNOSIS — D539 Nutritional anemia, unspecified: Secondary | ICD-10-CM | POA: Insufficient documentation

## 2021-06-25 DIAGNOSIS — R0683 Snoring: Secondary | ICD-10-CM

## 2021-06-25 LAB — CBC WITH DIFFERENTIAL/PLATELET
Basophils Absolute: 0 10*3/uL (ref 0.0–0.1)
Basophils Relative: 0.2 % (ref 0.0–3.0)
Eosinophils Absolute: 0.1 10*3/uL (ref 0.0–0.7)
Eosinophils Relative: 1.5 % (ref 0.0–5.0)
HCT: 41.1 % (ref 36.0–46.0)
Hemoglobin: 13.6 g/dL (ref 12.0–15.0)
Lymphocytes Relative: 32.4 % (ref 12.0–46.0)
Lymphs Abs: 2.6 10*3/uL (ref 0.7–4.0)
MCHC: 33 g/dL (ref 30.0–36.0)
MCV: 80.5 fl (ref 78.0–100.0)
Monocytes Absolute: 0.8 10*3/uL (ref 0.1–1.0)
Monocytes Relative: 9.3 % (ref 3.0–12.0)
Neutro Abs: 4.6 10*3/uL (ref 1.4–7.7)
Neutrophils Relative %: 56.6 % (ref 43.0–77.0)
Platelets: 284 10*3/uL (ref 150.0–400.0)
RBC: 5.11 Mil/uL (ref 3.87–5.11)
RDW: 14.5 % (ref 11.5–15.5)
WBC: 8.1 10*3/uL (ref 4.0–10.5)

## 2021-06-25 LAB — VITAMIN B12: Vitamin B-12: 314 pg/mL (ref 211–911)

## 2021-06-25 LAB — LIPID PANEL
Cholesterol: 163 mg/dL (ref 0–200)
HDL: 32.2 mg/dL — ABNORMAL LOW (ref >39.00)
LDL Cholesterol: 115 mg/dL — ABNORMAL HIGH (ref 0–99)
NonHDL: 131.26
Total CHOL/HDL Ratio: 5
Triglycerides: 80 mg/dL (ref 0.0–149.0)
VLDL: 16 mg/dL (ref 0.0–40.0)

## 2021-06-25 LAB — IBC + FERRITIN
Ferritin: 51.3 ng/mL (ref 10.0–291.0)
Iron: 52 ug/dL (ref 42–145)
Saturation Ratios: 15.5 % — ABNORMAL LOW (ref 20.0–50.0)
TIBC: 334.6 ug/dL (ref 250.0–450.0)
Transferrin: 239 mg/dL (ref 212.0–360.0)

## 2021-06-25 LAB — URINALYSIS, ROUTINE W REFLEX MICROSCOPIC
Bilirubin Urine: NEGATIVE
Ketones, ur: NEGATIVE
Nitrite: NEGATIVE
Specific Gravity, Urine: 1.02 (ref 1.000–1.030)
Total Protein, Urine: NEGATIVE
Urine Glucose: NEGATIVE
Urobilinogen, UA: 0.2 (ref 0.0–1.0)
pH: 6 (ref 5.0–8.0)

## 2021-06-25 LAB — HEPATIC FUNCTION PANEL
ALT: 18 U/L (ref 0–35)
AST: 18 U/L (ref 0–37)
Albumin: 3.9 g/dL (ref 3.5–5.2)
Alkaline Phosphatase: 75 U/L (ref 39–117)
Bilirubin, Direct: 0.1 mg/dL (ref 0.0–0.3)
Total Bilirubin: 0.4 mg/dL (ref 0.2–1.2)
Total Protein: 7.8 g/dL (ref 6.0–8.3)

## 2021-06-25 LAB — BASIC METABOLIC PANEL
BUN: 11 mg/dL (ref 6–23)
CO2: 28 mEq/L (ref 19–32)
Calcium: 9.1 mg/dL (ref 8.4–10.5)
Chloride: 104 mEq/L (ref 96–112)
Creatinine, Ser: 0.81 mg/dL (ref 0.40–1.20)
GFR: 93.81 mL/min (ref >60.00)
Glucose, Bld: 118 mg/dL — ABNORMAL HIGH (ref 70–99)
Potassium: 4.3 mEq/L (ref 3.5–5.1)
Sodium: 138 mEq/L (ref 135–145)

## 2021-06-25 LAB — TSH: TSH: 1.65 u[IU]/mL (ref 0.35–5.50)

## 2021-06-25 LAB — FOLATE: Folate: 10.1 ng/mL (ref >5.9)

## 2021-06-25 LAB — HEMOGLOBIN A1C: Hgb A1c MFr Bld: 6 % (ref 4.6–6.5)

## 2021-06-25 LAB — VITAMIN D 25 HYDROXY (VIT D DEFICIENCY, FRACTURES): VITD: <7 ng/mL — ABNORMAL LOW (ref 30.00–100.00)

## 2021-06-25 MED ORDER — LABETALOL HCL 100 MG PO TABS: 100.0000 mg | ORAL_TABLET | Freq: Two times a day (BID) | ORAL | 0 refills | Status: DC

## 2021-06-25 NOTE — Patient Instructions (Signed)
Hypertension, Adult High blood pressure (hypertension) is when the force of blood pumping through the arteries is too strong. The arteries are the blood vessels that carry blood from the heart throughout the body. Hypertension forces the heart to work harder to pump blood and may cause arteries to become narrow or stiff. Untreated or uncontrolled hypertension can lead to a heart attack, heart failure, a stroke, kidney disease, and other problems. A blood pressure reading consists of a higher number over a lower number. Ideally, your blood pressure should be below 120/80. The first ("top") number is called the systolic pressure. It is a measure of the pressure in your arteries as your heart beats. The second ("bottom") number is called the diastolic pressure. It is a measure of the pressure in your arteries as the heart relaxes. What are the causes? The exact cause of this condition is not known. There are some conditions that result in high blood pressure. What increases the risk? Certain factors may make you more likely to develop high blood pressure. Some of these risk factors are under your control, including: Smoking. Not getting enough exercise or physical activity. Being overweight. Having too much fat, sugar, calories, or salt (sodium) in your diet. Drinking too much alcohol. Other risk factors include: Having a personal history of heart disease, diabetes, high cholesterol, or kidney disease. Stress. Having a family history of high blood pressure and high cholesterol. Having obstructive sleep apnea. Age. The risk increases with age. What are the signs or symptoms? High blood pressure may not cause symptoms. Very high blood pressure (hypertensive crisis) may cause: Headache. Fast or irregular heartbeats (palpitations). Shortness of breath. Nosebleed. Nausea and vomiting. Vision changes. Severe chest pain, dizziness, and seizures. How is this diagnosed? This condition is diagnosed by  measuring your blood pressure while you are seated, with your arm resting on a flat surface, your legs uncrossed, and your feet flat on the floor. The cuff of the blood pressure monitor will be placed directly against the skin of your upper arm at the level of your heart. Blood pressure should be measured at least twice using the same arm. Certain conditions can cause a difference in blood pressure between your right and left arms. If you have a high blood pressure reading during one visit or you have normal blood pressure with other risk factors, you may be asked to: Return on a different day to have your blood pressure checked again. Monitor your blood pressure at home for 1 week or longer. If you are diagnosed with hypertension, you may have other blood or imaging tests to help your health care provider understand your overall risk for other conditions. How is this treated? This condition is treated by making healthy lifestyle changes, such as eating healthy foods, exercising more, and reducing your alcohol intake. You may be referred for counseling on a healthy diet and physical activity. Your health care provider may prescribe medicine if lifestyle changes are not enough to get your blood pressure under control and if: Your systolic blood pressure is above 130. Your diastolic blood pressure is above 80. Your personal target blood pressure may vary depending on your medical conditions, your age, and other factors. Follow these instructions at home: Eating and drinking  Eat a diet that is high in fiber and potassium, and low in sodium, added sugar, and fat. An example of this eating plan is called the DASH diet. DASH stands for Dietary Approaches to Stop Hypertension. To eat this way: Eat   plenty of fresh fruits and vegetables. Try to fill one half of your plate at each meal with fruits and vegetables. Eat whole grains, such as whole-wheat pasta, brown rice, or whole-grain bread. Fill about one  fourth of your plate with whole grains. Eat or drink low-fat dairy products, such as skim milk or low-fat yogurt. Avoid fatty cuts of meat, processed or cured meats, and poultry with skin. Fill about one fourth of your plate with lean proteins, such as fish, chicken without skin, beans, eggs, or tofu. Avoid pre-made and processed foods. These tend to be higher in sodium, added sugar, and fat. Reduce your daily sodium intake. Many people with hypertension should eat less than 1,500 mg of sodium a day. Do not drink alcohol if: Your health care provider tells you not to drink. You are pregnant, may be pregnant, or are planning to become pregnant. If you drink alcohol: Limit how much you have to: 0-1 drink a day for women. 0-2 drinks a day for men. Know how much alcohol is in your drink. In the [place] one drink equals one 12 oz bottle of beer (355 mL), one 5 oz glass of wine (148 mL), or one 1 oz glass of hard liquor (44 mL). Lifestyle  Work with your health care provider to maintain a healthy body weight or to lose weight. Ask what an ideal weight is for you. Get at least 30 minutes of exercise that causes your heart to beat faster (aerobic exercise) most days of the week. Activities may include walking, swimming, or biking. Include exercise to strengthen your muscles (resistance exercise), such as Pilates or lifting weights, as part of your weekly exercise routine. Try to do these types of exercises for 30 minutes at least 3 days a week. Do not use any products that contain nicotine or tobacco. These products include cigarettes, chewing tobacco, and vaping devices, such as e-cigarettes. If you need help quitting, ask your health care provider. Monitor your blood pressure at home as told by your health care provider. Keep all follow-up visits. This is important. Medicines Take over-the-counter and prescription medicines only as told by your health care provider. Follow directions carefully. Blood  pressure medicines must be taken as prescribed. Do not skip doses of blood pressure medicine. Doing this puts you at risk for problems and can make the medicine less effective. Ask your health care provider about side effects or reactions to medicines that you should watch for. Contact a health care provider if you: Think you are having a reaction to a medicine you are taking. Have headaches that keep coming back (recurring). Feel dizzy. Have swelling in your ankles. Have trouble with your vision. Get help right away if you: Develop a severe headache or confusion. Have unusual weakness or numbness. Feel faint. Have severe pain in your chest or abdomen. Vomit repeatedly. Have trouble breathing. These symptoms may be an emergency. Get help right away. Call 911. Do not wait to see if the symptoms will go away. Do not drive yourself to the hospital. Summary Hypertension is when the force of blood pumping through your arteries is too strong. If this condition is not controlled, it may put you at risk for serious complications. Your personal target blood pressure may vary depending on your medical conditions, your age, and other factors. For most people, a normal blood pressure is less than 120/80. Hypertension is treated with lifestyle changes, medicines, or a combination of both. Lifestyle changes include losing weight, eating a healthy,   low-sodium diet, exercising more, and limiting alcohol. This information is not intended to replace advice given to you by your health care provider. Make sure you discuss any questions you have with your health care provider. Document Revised: 12/16/2020 Document Reviewed: 12/16/2020 Elsevier Patient Education  2023 Elsevier Inc.  

## 2021-06-25 NOTE — Progress Notes (Signed)
? ?Subjective:  ?Patient ID: Miranda Stewart, female    DOB: 01-Oct-1985  Age: 36 y.o. MRN: [id number] ? ?CC: Hypertension and Anemia ? ? ?HPI ?Miranda Stewart presents for establishing. ? ?Miranda Stewart has a history of anemia and complains of chronic fatigue and numbness and tingling in Miranda Stewart hands.  Miranda Stewart has a history of hypertension but is not currently taking an antihypertensive.  Miranda Stewart complains of heavy snoring but denies chest pain, shortness of breath, palpitations, or edema.  Miranda Stewart has had a few episodes recently of headache and blurred vision. ? ?History ?Shy has a past medical history of Anxiety, Benign essential HTN, Depression, Dermoid cyst, Family history of cardiomyopathy (05/30/2018), Migraines, Obesity, Polycystic ovarian disease, Postpartum state, Seasonal allergies, and Vaginal Pap smear, abnormal.  ? ?Miranda Stewart has a past surgical history that includes Tonsillectomy; Adenoidectomy; Cesarean section multi-gestational (N/A, 01/04/2018); and Unilateral salpingectomy (Left, 01/04/2018).  ? ?Miranda Stewart family history includes Diabetes Mellitus I in Miranda Stewart maternal grandmother and paternal grandmother; Heart disease in Miranda Stewart mother.Miranda Stewart reports that Miranda Stewart has been smoking cigarettes. Miranda Stewart has been smoking an average of .25 packs per day. Miranda Stewart has never been exposed to tobacco smoke. Miranda Stewart has never used smokeless tobacco. Miranda Stewart reports that Miranda Stewart does not currently use alcohol. Miranda Stewart reports that Miranda Stewart does not use drugs. ? ?Outpatient Medications Prior to Visit  ?Medication Sig Dispense Refill  ? metoprolol succinate (TOPROL-XL) 25 MG 24 hr tablet Take 1 tablet (25 mg total) by mouth daily. 90 tablet 3  ? norethindrone (MICRONOR,CAMILA,ERRIN) 0.35 MG tablet Take 1 tablet by mouth daily.    ? ?No facility-administered medications prior to visit.  ? ? ?ROS ?Review of Systems  ?Constitutional:  Positive for fatigue. Negative for appetite change, diaphoresis and unexpected weight change.  ?HENT: Negative.    ?Eyes:  Positive for visual disturbance.   ?Respiratory:  Negative for apnea, cough, shortness of breath and wheezing.   ?Cardiovascular:  Negative for chest pain, palpitations and leg swelling.  ?Gastrointestinal:  Negative for abdominal pain, constipation, nausea and vomiting.  ?Genitourinary:  Negative for difficulty urinating.  ?Musculoskeletal: Negative.   ?Skin: Negative.   ?Neurological:  Positive for numbness and headaches. Negative for dizziness, weakness and light-headedness.  ?Hematological:  Negative for adenopathy. Does not bruise/bleed easily.  ?Psychiatric/Behavioral: Negative.    ? ?Objective:  ?BP (!) 148/90 (BP Location: Left Arm)   Pulse 94   Temp 97.8 ?F (36.6 ?C) (Oral)   Resp 16   Ht '5\' 8"'$  (1.727 m)   Wt (!) 377 lb (171 kg)   LMP 06/19/2021 (Exact Date)   SpO2 97%   BMI 57.32 kg/m?  ? ?Physical Exam ?Constitutional:   ?   Appearance: Miranda Stewart is obese. Miranda Stewart is not ill-appearing.  ?HENT:  ?   Mouth/Throat:  ?   Mouth: Mucous membranes are moist.  ?Eyes:  ?   General: No scleral icterus. ?   Conjunctiva/sclera: Conjunctivae normal.  ?Cardiovascular:  ?   Rate and Rhythm: Normal rate.  ?   Heart sounds: Normal heart sounds, S1 normal and S2 normal. No murmur heard. ?  No gallop.  ?   Comments: EKG- ?NSR with SA, 75 bpm ?+ LVH ?No Q waves ?Pulmonary:  ?   Effort: Pulmonary effort is normal.  ?   Breath sounds: No stridor. No wheezing, rhonchi or rales.  ?Abdominal:  ?   General: Abdomen is protuberant. Bowel sounds are normal. There is no distension.  ?   Palpations: Abdomen is soft. There is no  hepatomegaly, splenomegaly or mass.  ?   Tenderness: There is no abdominal tenderness.  ?Musculoskeletal:     ?   General: Normal range of motion.  ?   Cervical back: Neck supple.  ?   Right lower leg: No edema.  ?   Left lower leg: No edema.  ?Lymphadenopathy:  ?   Cervical: No cervical adenopathy.  ?Skin: ?   General: Skin is warm and dry.  ?Neurological:  ?   General: No focal deficit present.  ?   Mental Status: Miranda Stewart is alert and oriented to  person, place, and time.  ?Psychiatric:     ?   Mood and Affect: Mood normal.     ?   Behavior: Behavior normal.  ? ? ?Lab Results  ?Component Value Date  ? WBC 8.1 06/25/2021  ? HGB 13.6 06/25/2021  ? HCT 41.1 06/25/2021  ? PLT 284.0 06/25/2021  ? GLUCOSE 118 (H) 06/25/2021  ? CHOL 163 06/25/2021  ? TRIG 80.0 06/25/2021  ? HDL 32.20 (L) 06/25/2021  ? LDLCALC 115 (H) 06/25/2021  ? ALT 18 06/25/2021  ? AST 18 06/25/2021  ? NA 138 06/25/2021  ? K 4.3 06/25/2021  ? CL 104 06/25/2021  ? CREATININE 0.81 06/25/2021  ? BUN 11 06/25/2021  ? CO2 28 06/25/2021  ? TSH 1.65 06/25/2021  ? HGBA1C 6.0 06/25/2021  ?  ? ?Assessment & Plan:  ? ?Miranda Stewart was seen today for hypertension and anemia. ? ?Diagnoses and all orders for this visit: ? ?Morbid obesity ([place])- Miranda Stewart has prediabetes.  The rest of Miranda Stewart labs are negative for secondary causes or endorgan damage. ?-     EKG 12-Lead ?-     Lipid panel; Future ?-     TSH; Future ?-     Hepatic function panel; Future ?-     VITAMIN D 25 Hydroxy (Vit-D Deficiency, Fractures); Future ?-     VITAMIN D 25 Hydroxy (Vit-D Deficiency, Fractures) ?-     Hepatic function panel ?-     TSH ?-     Lipid panel ? ?Primary hypertension- Miranda Stewart blood pressure is not well controlled and Miranda Stewart has LVH on Miranda Stewart EKG.  I recommended that Miranda Stewart Stewart taking labetalol. ?-     EKG 12-Lead ?-     Basic metabolic panel; Future ?-     CBC with Differential/Platelet; Future ?-     TSH; Future ?-     Urinalysis, Routine w reflex microscopic; Future ?-     Hepatic function panel; Future ?-     VITAMIN D 25 Hydroxy (Vit-D Deficiency, Fractures); Future ?-     labetalol (NORMODYNE) 100 MG tablet; Take 1 tablet (100 mg total) by mouth 2 (two) times daily. ?-     VITAMIN D 25 Hydroxy (Vit-D Deficiency, Fractures) ?-     Hepatic function panel ?-     Urinalysis, Routine w reflex microscopic ?-     TSH ?-     CBC with Differential/Platelet ?-     Basic metabolic panel ? ?Deficiency anemia- Miranda Stewart is no longer anemic and Miranda Stewart vitamin  levels are normal. ?-     Vitamin B12; Future ?-     IBC + Ferritin; Future ?-     CBC with Differential/Platelet; Future ?-     Vitamin B1; Future ?-     Hepatic function panel; Future ?-     Folate; Future ?-     Folate ?-     Hepatic function panel ?-  Vitamin B1 ?-     CBC with Differential/Platelet ?-     IBC + Ferritin ?-     Vitamin B12 ? ?Prediabetes ?-     Hemoglobin A1c; Future ?-     Hemoglobin A1c ? ?Loud snoring ?-     Ambulatory referral to Pulmonology ? ?LVH (left ventricular hypertrophy) due to hypertensive disease, without heart failure- Will treat the HTN. ? ?Vitamin D deficiency disease ?-     Cholecalciferol 1.25 MG (50000 UT) capsule; Take 1 capsule (50,000 Units total) by mouth once a week. ? ? ?I have discontinued Miranda Stewart norethindrone and metoprolol succinate. I am also having Miranda Stewart on labetalol and Cholecalciferol. ? ?Meds ordered this encounter  ?Medications  ? labetalol (NORMODYNE) 100 MG tablet  ?  Sig: Take 1 tablet (100 mg total) by mouth 2 (two) times daily.  ?  Dispense:  180 tablet  ?  Refill:  0  ? Cholecalciferol 1.25 MG (50000 UT) capsule  ?  Sig: Take 1 capsule (50,000 Units total) by mouth once a week.  ?  Dispense:  12 capsule  ?  Refill:  1  ? ? ? ?Follow-up: Return in about 3 months (around 09/25/2021). ? ?[name], MD ?

## 2021-06-26 DIAGNOSIS — E559 Vitamin D deficiency, unspecified: Secondary | ICD-10-CM | POA: Insufficient documentation

## 2021-06-26 MED ORDER — CHOLECALCIFEROL 1.25 MG (50000 UT) PO CAPS: 50000.0000 [IU] | ORAL_CAPSULE | ORAL | 1 refills | Status: DC

## 2021-06-29 ENCOUNTER — Encounter: Payer: Self-pay | Admitting: Internal Medicine

## 2021-07-02 LAB — VITAMIN B1: Vitamin B1 (Thiamine): 8 nmol/L (ref 8–30)

## 2021-07-06 ENCOUNTER — Institutional Professional Consult (permissible substitution): Payer: Medicaid Other | Admitting: Primary Care

## 2021-07-07 ENCOUNTER — Ambulatory Visit (INDEPENDENT_AMBULATORY_CARE_PROVIDER_SITE_OTHER): Payer: Medicaid Other | Admitting: Primary Care

## 2021-07-07 ENCOUNTER — Encounter: Payer: Self-pay | Admitting: Primary Care

## 2021-07-07 VITALS — BP 132/74 | HR 81 | Temp 97.7°F | Ht 68.0 in | Wt 378.4 lb

## 2021-07-07 DIAGNOSIS — R0683 Snoring: Secondary | ICD-10-CM | POA: Diagnosis not present

## 2021-07-07 NOTE — Assessment & Plan Note (Signed)
-   Patient has symptoms of loud snoring, restless sleep, daytime sleepiness.  [place] 13.  Concern patient could have obstructive sleep apnea, needs polysomnography to evaluate. Discussed risk of untreated sleep apnea including cardiac arrhythmias, pulm HTN, stroke, DM. We briefly reviewed treatment options. Encouraged patient to work on weight loss efforts and focus on side sleeping position/elevate head of bed. Advised against driving if experiencing excessive daytime sleepiness. Follow-up in 4-6 weeks to review sleep study results and discuss treatment options further. ?

## 2021-07-07 NOTE — Progress Notes (Signed)
? ?'@Patient'$  ID: Miranda Stewart, female    DOB: 10/16/1985, 36 y.o.   MRN: [id number] ? ?Chief Complaint  ?Patient presents with  ? Follow-up  ?  Snoring, morning headache   ? ? ?Referring provider: ?Janith Lima, MD ? ?HPI: ?36 year old female, former smoker.  Past medical history significant for hypertension, left ventricular hypertrophy, prediabetes, history of cesarean delivery, vitamin D deficiency, morbid obesity. ? ?07/07/2021 ?Patient presents today for sleep consult.  She has symptoms of loud snoring, daytime sleepiness and restless sleep. 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 She has twin boy who are 36 years old. Her typical bedtime is between 12am-2am. She is not tired at night. Her fiance will have to remind her to go to bed. It does not take her long to fall asleep. She wakes up 3-4 times at night. Her children tend to wake up in the middle of the night and sleep with her. She starts her day at 7am. She will occasionally take a nap, she gets sleepy around 10am. She gets a burst of energy at night time. She has a family history of sleep apnea.  Denies narcolepsy, cataplexy, sleep walking.  ? ?Sleep questionnaire ?Symptoms-   loud snoring, restless sleep, daytime sleepiness ?Prior sleep study-none ?Bedtime-12 AM-2 AM ?Time to fall asleep-not long ?Nocturnal awakenings- 3-4 times ?Out of bed/start of day-7 AM ?Weight changes-none ?Do you operate heavy machinery-no ?Do you currently wear CPAP-no ?Do you current wear oxygen-no ?Epworth-13 ? ?Allergies  ?Allergen Reactions  ? Doxycycline Nausea And Vomiting  ?  (patient notes that only the tablets have caused this)  ? ? ?There is no immunization history for the selected administration types on file for this patient. ? ?Past Medical History:  ?Diagnosis Date  ? Anxiety   ? Benign essential HTN   ? Depression   ? Dermoid cyst   ? per pt, left ovary  ? Family history of cardiomyopathy 05/30/2018  ? Migraines   ? Obesity   ? Polycystic ovarian disease   ?  Postpartum state   ? Seasonal allergies   ? Vaginal Pap smear, abnormal   ? ? ?Tobacco History: ?Social History  ? ?Tobacco Use  ?Smoking Status Former  ? Packs/day: 0.25  ? Types: Cigarettes  ? Passive exposure: Never  ?Smokeless Tobacco Never  ?Tobacco Comments  ? Pt states stopping smoking 2 weeks ago. ARJ, RN   ? ?Counseling given: Not Answered ?Tobacco comments: Pt states stopping smoking 2 weeks ago. ARJ, RN  ? ? ?Outpatient Medications Prior to Visit  ?Medication Sig Dispense Refill  ? Cholecalciferol 1.25 MG (50000 UT) capsule Take 1 capsule (50,000 Units total) by mouth once a week. 12 capsule 1  ? labetalol (NORMODYNE) 100 MG tablet Take 1 tablet (100 mg total) by mouth 2 (two) times daily. 180 tablet 0  ? ?No facility-administered medications prior to visit.  ? ?Review of Systems ? ?Review of Systems  ?Constitutional: Negative.   ?Respiratory: Negative.    ?Cardiovascular: Negative.   ? ? ?Physical Exam ? ?BP 132/74 (BP Location: Left Arm, Patient Position: Sitting, Cuff Size: Large)   Pulse 81   Temp 97.7 ?F (36.5 ?C) (Oral)   Ht '5\' 8"'$  (1.727 m)   Wt (!) 378 lb 6.4 oz (171.6 kg)   LMP 06/19/2021 (Exact Date)   SpO2 98%   BMI 57.54 kg/m?  ?Physical Exam ?Constitutional:   ?   Appearance: Normal appearance.  ?HENT:  ?   Head: Normocephalic and atraumatic.  ?  Mouth/Throat:  ?   Mouth: Mucous membranes are moist.  ?   Pharynx: Oropharynx is clear.  ?   Comments: Mallampati class III ?Cardiovascular:  ?   Rate and Rhythm: Normal rate and regular rhythm.  ?Pulmonary:  ?   Effort: Pulmonary effort is normal.  ?   Breath sounds: Normal breath sounds.  ?Musculoskeletal:     ?   General: Normal range of motion.  ?Skin: ?   General: Skin is warm and dry.  ?Neurological:  ?   General: No focal deficit present.  ?   Mental Status: She is alert and oriented to person, place, and time. Mental status is at baseline.  ?Psychiatric:     ?   Mood and Affect: Mood normal.     ?   Behavior: Behavior normal.     ?    Thought Content: Thought content normal.     ?   Judgment: Judgment normal.  ?  ? ?Lab Results: ? ?CBC ?   ?Component Value Date/Time  ? WBC 8.1 06/25/2021 0845  ? RBC 5.11 06/25/2021 0845  ? HGB 13.6 06/25/2021 0845  ? HCT 41.1 06/25/2021 0845  ? PLT 284.0 06/25/2021 0845  ? MCV 80.5 06/25/2021 0845  ? MCH 27.8 01/06/2018 1822  ? MCHC 33.0 06/25/2021 0845  ? RDW 14.5 06/25/2021 0845  ? LYMPHSABS 2.6 06/25/2021 0845  ? MONOABS 0.8 06/25/2021 0845  ? EOSABS 0.1 06/25/2021 0845  ? BASOSABS 0.0 06/25/2021 0845  ? ? ?BMET ?   ?Component Value Date/Time  ? NA 138 06/25/2021 0845  ? K 4.3 06/25/2021 0845  ? CL 104 06/25/2021 0845  ? CO2 28 06/25/2021 0845  ? GLUCOSE 118 (H) 06/25/2021 0845  ? BUN 11 06/25/2021 0845  ? CREATININE 0.81 06/25/2021 0845  ? CALCIUM 9.1 06/25/2021 0845  ? GFRNONAA >60 01/06/2018 1822  ? GFRAA >60 01/06/2018 1822  ? ? ?BNP ?No results found for: BNP ? ?ProBNP ?No results found for: PROBNP ? ?Imaging: ?No results found. ? ? ?Assessment & Plan:  ? ?Loud snoring ?- Patient has symptoms of loud snoring, restless sleep, daytime sleepiness.  Epworth 13.  Concern patient could have obstructive sleep apnea, needs polysomnography to evaluate. Discussed risk of untreated sleep apnea including cardiac arrhythmias, pulm HTN, stroke, DM. We briefly reviewed treatment options. Encouraged patient to work on weight loss efforts and focus on side sleeping position/elevate head of bed. Advised against driving if experiencing excessive daytime sleepiness. Follow-up in 4-6 weeks to review sleep study results and discuss treatment options further. ? ? ?Martyn Ehrich, NP ?07/07/2021 ? ?

## 2021-07-07 NOTE — Progress Notes (Signed)
Reviewed and agree with assessment/plan. ? ? ?Chesley Mires, MD ?[place] ?07/07/2021, 10:12 AM ?Pager:  [phone] ? ?

## 2021-07-07 NOTE — Patient Instructions (Addendum)
Sleep apnea is defined as period of 10 seconds or longer when you stop breathing at night. This can happen multiple times a night. Dx sleep apnea is when this occurs more than 5 times an hour.  ?  ?Mild OSA 5-15 apneic events an hour ?Moderate OSA 15-30 apneic events an hour ?Severe OSA > 30 apneic events an hour ?  ?Untreated sleep apnea puts you at higher risk for cardiac arrhythmias, pulmonary HTN, stroke and diabetes ?  ?Treatment options include weight loss, side sleeping position, oral appliance, CPAP therapy or referral to ENT for possible surgical options  ?  ?Recommendations: ?Focus on side sleeping position ?Work on weight loss efforts  ?Do not drive if experiencing excessive daytime sleepiness of fatigue  ?  ?Orders: ?Polysomnography re: snoring (ordered) ?  ?Follow-up: ?Call once you've have completed your sleep study to schedule follow-up visit  ?

## 2021-07-16 ENCOUNTER — Other Ambulatory Visit: Payer: Self-pay | Admitting: Internal Medicine

## 2021-07-16 DIAGNOSIS — R7303 Prediabetes: Secondary | ICD-10-CM

## 2021-07-16 MED ORDER — RYBELSUS 3 MG PO TABS: 3.0000 mg | ORAL_TABLET | Freq: Every day | ORAL | 0 refills | Status: DC

## 2021-07-22 ENCOUNTER — Other Ambulatory Visit: Payer: Self-pay | Admitting: Internal Medicine

## 2021-07-22 MED ORDER — PHENTERMINE HCL 37.5 MG PO TABS: 37.5000 mg | ORAL_TABLET | Freq: Every day | ORAL | 0 refills | Status: DC

## 2021-09-20 ENCOUNTER — Other Ambulatory Visit: Payer: Self-pay | Admitting: Internal Medicine

## 2021-09-20 DIAGNOSIS — I10 Essential (primary) hypertension: Secondary | ICD-10-CM

## 2021-09-24 ENCOUNTER — Ambulatory Visit (INDEPENDENT_AMBULATORY_CARE_PROVIDER_SITE_OTHER): Payer: Medicaid Other | Admitting: Internal Medicine

## 2021-09-24 ENCOUNTER — Encounter: Payer: Self-pay | Admitting: Internal Medicine

## 2021-09-24 VITALS — BP 124/78 | HR 88 | Temp 98.2°F | Resp 16 | Ht 68.0 in | Wt 367.0 lb

## 2021-09-24 DIAGNOSIS — I119 Hypertensive heart disease without heart failure: Secondary | ICD-10-CM | POA: Diagnosis not present

## 2021-09-24 DIAGNOSIS — E559 Vitamin D deficiency, unspecified: Secondary | ICD-10-CM | POA: Diagnosis not present

## 2021-09-24 DIAGNOSIS — R7303 Prediabetes: Secondary | ICD-10-CM | POA: Diagnosis not present

## 2021-09-24 DIAGNOSIS — K5904 Chronic idiopathic constipation: Secondary | ICD-10-CM

## 2021-09-24 DIAGNOSIS — I10 Essential (primary) hypertension: Secondary | ICD-10-CM

## 2021-09-24 DIAGNOSIS — G5603 Carpal tunnel syndrome, bilateral upper limbs: Secondary | ICD-10-CM

## 2021-09-24 MED ORDER — CARVEDILOL PHOSPHATE ER 20 MG PO CP24: 20.0000 mg | ORAL_CAPSULE | Freq: Every day | ORAL | 1 refills | Status: DC

## 2021-09-24 MED ORDER — LUBIPROSTONE 24 MCG PO CAPS: 24.0000 ug | ORAL_CAPSULE | Freq: Two times a day (BID) | ORAL | 1 refills | Status: DC

## 2021-09-24 MED ORDER — LABETALOL HCL 100 MG PO TABS: 100.0000 mg | ORAL_TABLET | Freq: Two times a day (BID) | ORAL | 1 refills | Status: DC

## 2021-09-24 MED ORDER — CHOLECALCIFEROL 1.25 MG (50000 UT) PO CAPS
50000.0000 [IU] | ORAL_CAPSULE | ORAL | 0 refills | Status: DC
Start: 2021-09-24 — End: 2022-02-10

## 2021-09-24 NOTE — Progress Notes (Signed)
Subjective:  Patient ID: Miranda Stewart, female    DOB: 1986/01/24  Age: 36 y.o. MRN: [id number]  CC: Hypertension   HPI Miranda Stewart presents for f/up -   She complains of a 3-year history of constipation.  She has not gotten much symptom relief with fiber and a probiotic.  She denies abdominal pain or bloating.  She also complains of a several year history of diffuse pain with N/T in both upper extremities.  She initially told me that she was only taking labetalol once a day and would like to try once a day formulation.   Outpatient Medications Prior to Visit  Medication Sig Dispense Refill   etonogestrel (NEXPLANON) 68 MG IMPL implant 1 each (68 mg total) by Subdermal route. 1 each 0   phentermine (ADIPEX-P) 37.5 MG tablet Take 1 tablet (37.5 mg total) by mouth daily before breakfast. 90 tablet 0   Cholecalciferol 1.25 MG (50000 UT) capsule Take 1 capsule (50,000 Units total) by mouth once a week. 12 capsule 1   labetalol (NORMODYNE) 100 MG tablet TAKE 1 TABLET BY MOUTH TWICE A DAY 180 tablet 0   Semaglutide (RYBELSUS) 3 MG TABS Take 3 mg by mouth daily. 30 tablet 0   No facility-administered medications prior to visit.    ROS Review of Systems  Constitutional: Negative.  Negative for diaphoresis and fatigue.  HENT: Negative.    Eyes: Negative.   Respiratory:  Negative for cough, chest tightness, shortness of breath and wheezing.   Cardiovascular:  Negative for chest pain, palpitations and leg swelling.  Gastrointestinal:  Negative for abdominal pain, diarrhea and vomiting.  Endocrine: Negative.   Genitourinary: Negative.  Negative for difficulty urinating.  Musculoskeletal:  Negative for neck pain.  Skin: Negative.   Neurological:  Positive for numbness. Negative for dizziness and weakness.  Hematological:  Negative for adenopathy. Does not bruise/bleed easily.  Psychiatric/Behavioral: Negative.      Objective:  BP 124/78 (BP Location: Left Arm, Patient Position:  Sitting, Cuff Size: Large)   Pulse 88   Temp 98.2 F (36.8 C) (Oral)   Resp 16   Ht '5\' 8"'$  (1.727 m)   Wt (!) 367 lb (166.5 kg)   LMP 08/07/2021 (Exact Date) Comment: implant  SpO2 96%   BMI 55.80 kg/m   BP Readings from Last 3 Encounters:  09/24/21 124/78  07/07/21 132/74  06/25/21 (!) 148/90    Wt Readings from Last 3 Encounters:  09/24/21 (!) 367 lb (166.5 kg)  07/07/21 (!) 378 lb 6.4 oz (171.6 kg)  06/25/21 (!) 377 lb (171 kg)    Physical Exam Vitals reviewed.  HENT:     Mouth/Throat:     Mouth: Mucous membranes are moist.  Eyes:     General: No scleral icterus. Cardiovascular:     Rate and Rhythm: Normal rate and regular rhythm.     Heart sounds: No murmur heard. Pulmonary:     Effort: Pulmonary effort is normal.     Breath sounds: No stridor. No wheezing, rhonchi or rales.  Abdominal:     Palpations: There is no mass.     Tenderness: There is no abdominal tenderness. There is no guarding.  Musculoskeletal:        General: Normal range of motion.     Cervical back: Neck supple.     Right lower leg: No edema.     Left lower leg: No edema.  Lymphadenopathy:     Cervical: No cervical adenopathy.  Skin:  General: Skin is warm and dry.  Neurological:     General: No focal deficit present.     Mental Status: She is alert. Mental status is at baseline.  Psychiatric:        Mood and Affect: Mood normal.        Behavior: Behavior normal.     Lab Results  Component Value Date   WBC 8.1 06/25/2021   HGB 13.6 06/25/2021   HCT 41.1 06/25/2021   PLT 284.0 06/25/2021   GLUCOSE 118 (H) 06/25/2021   CHOL 163 06/25/2021   TRIG 80.0 06/25/2021   HDL 32.20 (L) 06/25/2021   LDLCALC 115 (H) 06/25/2021   ALT 18 06/25/2021   AST 18 06/25/2021   NA 138 06/25/2021   K 4.3 06/25/2021   CL 104 06/25/2021   CREATININE 0.81 06/25/2021   BUN 11 06/25/2021   CO2 28 06/25/2021   TSH 1.65 06/25/2021   HGBA1C 6.0 06/25/2021    No results found.  Assessment &  Plan:   [name] was seen today for hypertension.  Diagnoses and all orders for this visit:  Primary hypertension- Her blood pressure is adequately well controlled.  Carvedilol was too expensive so she agrees to take labetalol twice a day. -     Discontinue: carvedilol (COREG CR) 20 MG 24 hr capsule; Take 1 capsule (20 mg total) by mouth daily. -     labetalol (NORMODYNE) 100 MG tablet; Take 1 tablet (100 mg total) by mouth 2 (two) times daily.  LVH (left ventricular hypertrophy) due to hypertensive disease, without heart failure -     Discontinue: carvedilol (COREG CR) 20 MG 24 hr capsule; Take 1 capsule (20 mg total) by mouth daily. -     labetalol (NORMODYNE) 100 MG tablet; Take 1 tablet (100 mg total) by mouth 2 (two) times daily.  Prediabetes  Vitamin D deficiency disease -     Cholecalciferol 1.25 MG (50000 UT) capsule; Take 1 capsule (50,000 Units total) by mouth once a week.  Chronic idiopathic constipation -     lubiprostone (AMITIZA) 24 MCG capsule; Take 1 capsule (24 mcg total) by mouth 2 (two) times daily with a meal.  Bilateral carpal tunnel syndrome -     Ambulatory referral to Physical Medicine Rehab   I have discontinued [name]'s Rybelsus and carvedilol. I have also changed her labetalol. Additionally, I am having her start on lubiprostone. Lastly, I am having her maintain her phentermine, Cholecalciferol, and etonogestrel.  Meds ordered this encounter  Medications   Cholecalciferol 1.25 MG (50000 UT) capsule    Sig: Take 1 capsule (50,000 Units total) by mouth once a week.    Dispense:  12 capsule    Refill:  0   lubiprostone (AMITIZA) 24 MCG capsule    Sig: Take 1 capsule (24 mcg total) by mouth 2 (two) times daily with a meal.    Dispense:  180 capsule    Refill:  1   DISCONTD: carvedilol (COREG CR) 20 MG 24 hr capsule    Sig: Take 1 capsule (20 mg total) by mouth daily.    Dispense:  90 capsule    Refill:  1   labetalol (NORMODYNE) 100 MG  tablet    Sig: Take 1 tablet (100 mg total) by mouth 2 (two) times daily.    Dispense:  180 tablet    Refill:  1     Follow-up: Return in about 6 months (around 03/27/2022).  [name], MD

## 2021-09-24 NOTE — Patient Instructions (Signed)

## 2021-09-30 ENCOUNTER — Encounter: Payer: Self-pay | Admitting: Physical Medicine & Rehabilitation

## 2021-10-19 ENCOUNTER — Encounter: Payer: Self-pay | Admitting: Internal Medicine

## 2021-11-10 ENCOUNTER — Encounter: Payer: Medicaid Other | Attending: Physical Medicine & Rehabilitation | Admitting: Physical Medicine & Rehabilitation

## 2021-11-10 ENCOUNTER — Encounter: Payer: Self-pay | Admitting: Physical Medicine & Rehabilitation

## 2021-11-10 VITALS — BP 116/88 | HR 93 | Ht 68.0 in | Wt 355.8 lb

## 2021-11-10 DIAGNOSIS — G5603 Carpal tunnel syndrome, bilateral upper limbs: Secondary | ICD-10-CM | POA: Diagnosis present

## 2021-11-10 DIAGNOSIS — R200 Anesthesia of skin: Secondary | ICD-10-CM | POA: Diagnosis present

## 2021-11-10 MED ORDER — METHYLPREDNISOLONE 4 MG PO TBPK: ORAL_TABLET | ORAL | 0 refills | Status: DC

## 2021-11-10 NOTE — Progress Notes (Signed)
Subjective:    Patient ID: Miranda Stewart, female    DOB: 1985/05/28, 36 y.o.   MRN: [id number]  HPI CC:  Bilateral hand pain  36yo female with 5-6 year hx of progressive numbness, weakness affecting the Right index, middle finger and thumb, some proximal extension up to RIght shoulder, no neck pain  Pain worse with sleeping, also with bathing pt, washing dishes and hair.    Has wrist splints which she wears when the pain is bad No hx of medication use , has not tried OTC meds Left sided symptoms are similar but not as severe  PMH- No DM or THyroid issues  Pain Inventory Average Pain 9 Pain Right Now 5 My pain is intermittent, constant, burning, tingling, and aching  In the last 24 hours, has pain interfered with the following? General activity 9 Relation with others 9 Enjoyment of life 9 What TIME of day is your pain at its worst? morning , evening, and night Sleep (in general) Fair  Pain is worse with: some activites Pain improves with:  movement/massage Relief from Meds:  na  walk without assistance how many minutes can you walk? unlimited ability to climb steps?  yes do you drive?  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  weakness numbness tingling depression anxiety  New pt  New pt    Family History  Problem Relation Age of Onset   Heart disease Mother    Diabetes Mellitus I Maternal Grandmother    Diabetes Mellitus I Paternal Grandmother    Social History   Socioeconomic History   Marital status: Single    Spouse name: Not on file   Number of children: Not on file   Years of education: Not on file   Highest education level: Not on file  Occupational History   Not on file  Tobacco Use   Smoking status: Former    Packs/day: 0.25    Types: Cigarettes    Passive exposure: Never   Smokeless tobacco: Never  Vaping Use   Vaping Use: Never used  Substance and Sexual Activity   Alcohol use: Not Currently     Comment: occ   Drug use: No   Sexual activity: Yes    Partners: Male    Birth control/protection: Implant  Other Topics Concern   Not on file  Social History Narrative   Not on file   Social Determinants of Health   Financial Resource Strain: Not on file  Food Insecurity: Not on file  Transportation Needs: Not on file  Physical Activity: Not on file  Stress: Not on file  Social Connections: Not on file   Past Surgical History:  Procedure Laterality Date   ADENOIDECTOMY     CESAREAN SECTION MULTI-GESTATIONAL N/A 01/04/2018   Procedure: [place];  Surgeon: Allyn Kenner, DO;  Location: [facility];  Service: Obstetrics;  Laterality: N/A;   TONSILLECTOMY     UNILATERAL SALPINGECTOMY Left 01/04/2018   Procedure: UNILATERAL SALPINGECTOMY;  Surgeon: Allyn Kenner, DO;  Location: [facility];  Service: Obstetrics;  Laterality: Left;   Past Medical History:  Diagnosis Date   Anxiety    Benign essential HTN    Depression    Dermoid cyst    per pt, left ovary   Family history of cardiomyopathy 05/30/2018   Migraines    Obesity    Polycystic ovarian disease    Postpartum state    Seasonal allergies  Vaginal Pap smear, abnormal    Ht '5\' 8"'$  (1.727 m)   Wt (!) 355 lb 12.8 oz (161.4 kg)   BMI 54.10 kg/m   Opioid Risk Score:   Fall Risk Score:  `1  Depression screen [facility] 2/9     11/10/2021    1:35 PM 06/25/2021    8:07 AM  Depression screen PHQ 2/9  Decreased Interest 1 1  Down, Depressed, Hopeless 2 1  PHQ - 2 Score 3 2  Altered sleeping 1 2  Tired, decreased energy 2 3  Change in appetite 2 2  Feeling bad or failure about yourself  1 1  Trouble concentrating 1 1  Moving slowly or fidgety/restless 0 0  Suicidal thoughts 0 0  PHQ-9 Score 10 11  Difficult doing work/chores Somewhat difficult      Review of Systems  Neurological:  Positive for weakness and numbness.  Psychiatric/Behavioral:  Positive for dysphoric mood.  The patient is nervous/anxious.   All other systems reviewed and are negative.     Objective:   Physical Exam Vitals and nursing note reviewed.  Constitutional:      Appearance: She is obese.  HENT:     Head: Normocephalic and atraumatic.  Eyes:     Extraocular Movements: Extraocular movements intact.     Conjunctiva/sclera: Conjunctivae normal.     Pupils: Pupils are equal, round, and reactive to light.  Musculoskeletal:     Comments: Negative Tinel's at the wrist bilaterally Positive reverse Phalen's right greater than left side producing paresthesias in the median nerve distribution No evidence of joint swelling in fingers hands or wrists. No range of motion limitation in the shoulders elbows wrists or fingers. No joint deformities noted Cervical spine range of motion is normal.  Skin:    General: Skin is warm and dry.  Neurological:     Mental Status: She is alert and oriented to person, place, and time.     Comments: Sensation is reduced in the index and middle fingers compared to the little finger bilaterally this is to both pinprick and light touch. Motor strength is 5/5 bilateral deltoid, bicep, tricep, grip, finger extension, thumb flexion and extension  Psychiatric:        Mood and Affect: Mood normal.        Behavior: Behavior normal.           Assessment & Plan:  1.  Bilateral hand numbness right greater than left hand mainly affecting median nerve distribution.  She has failed wrist splints.  Most likely diagnosis is carpal tunnel syndrome.  We discussed that she may benefit from a Medrol Dosepak however this may only yield some shorter term relief.  We discussed the need for further electrodiagnostic testing to assess severity and from there we can decide whether injections of the carpal tunnel versus surgical referral will be necessary.

## 2021-11-10 NOTE — Patient Instructions (Signed)
Carpal Tunnel Syndrome  Carpal tunnel syndrome is a condition that causes pain, weakness, and numbness in your hand and arm. Numbness is when you cannot feel an area in your body. The carpal tunnel is a narrow area that is on the palm side of your wrist. Repeated wrist motion or certain diseases may cause swelling in the tunnel. This swelling can pinch the main nerve in the wrist. This nerve is called the median nerve. What are the causes? This condition may be caused by: Moving your hand and wrist over and over again while doing a task. Injury to the wrist. Arthritis. A sac of fluid (cyst) or abnormal growth (tumor) in the carpal tunnel. Fluid buildup during pregnancy. Use of tools that vibrate. Sometimes the cause is not known. What increases the risk? The following factors may make you more likely to have this condition: Having a job that makes you do these things: Move your hand over and over again. Work with tools that vibrate, such as drills or sanders. Being a woman. Having diabetes, obesity, thyroid problems, or kidney failure. What are the signs or symptoms? Symptoms of this condition include: A tingling feeling in your fingers. Tingling or loss of feeling in your hand. Pain in your entire arm. This pain may get worse when you bend your wrist and elbow for a long time. Pain in your wrist that goes up your arm to your shoulder. Pain that goes down into your palm or fingers. Weakness in your hands. You may find it hard to grab and hold items. You may feel worse at night. How is this treated? This condition may be treated with: Lifestyle changes. You will be asked to stop or change the activity that caused your problem. Doing exercises and activities that make bones, muscles, and tendons stronger (physical therapy). Learning how to use your hand again (occupational therapy). Medicines for pain and swelling. You may have injections in your wrist. A wrist splint or  brace. Surgery. Follow these instructions at home: If you have a splint or brace: Wear the splint or brace as told by your doctor. Take it off only as told by your doctor. Loosen the splint if your fingers: Tingle. Become numb. Turn cold and blue. Keep the splint or brace clean. If the splint or brace is not waterproof: Do not let it get wet. Cover it with a watertight covering when you take a bath or a shower. Managing pain, stiffness, and swelling If told, put ice on the painful area: If you have a removable splint or brace, remove it as told by your doctor. Put ice in a plastic bag. Place a towel between your skin and the bag. Leave the ice on for 20 minutes, 2-3 times per day. Do not fall asleep with the cold pack on your skin. Take off the ice if your skin turns bright red. This is very important. If you cannot feel pain, heat, or cold, you have a greater risk of damage to the area. Move your fingers often to reduce stiffness and swelling. General instructions Take over-the-counter and prescription medicines only as told by your doctor. Rest your wrist from any activity that may cause pain. If needed, talk with your boss at work about changes that can help your wrist heal. Do exercises as told by your doctor, physical therapist, or occupational therapist. Keep all follow-up visits. Contact a doctor if: You have new symptoms. Medicine does not help your pain. Your symptoms get worse. Get help right  away if: You have very bad numbness or tingling in your wrist or hand. Summary Carpal tunnel syndrome is a condition that causes pain in your hand and arm. It is often caused by repeated wrist motions. Lifestyle changes and medicines are used to treat this problem. Surgery may help in very bad cases. Follow your doctor's instructions about wearing a splint, resting your wrist, keeping follow-up visits, and calling for help. This information is not intended to replace advice given  to you by your health care provider. Make sure you discuss any questions you have with your health care provider. Document Revised: 06/21/2019 Document Reviewed: 06/21/2019 Elsevier Patient Education  2023 [employer]  

## 2022-01-12 ENCOUNTER — Encounter: Payer: Medicaid Other | Admitting: Physical Medicine & Rehabilitation

## 2022-01-22 ENCOUNTER — Other Ambulatory Visit: Payer: Self-pay | Admitting: Internal Medicine

## 2022-02-10 ENCOUNTER — Other Ambulatory Visit: Payer: Self-pay | Admitting: Internal Medicine

## 2022-02-10 DIAGNOSIS — E559 Vitamin D deficiency, unspecified: Secondary | ICD-10-CM

## 2022-03-04 ENCOUNTER — Ambulatory Visit: Payer: Medicaid Other | Admitting: Physical Medicine & Rehabilitation

## 2022-03-14 ENCOUNTER — Other Ambulatory Visit: Payer: Self-pay

## 2022-03-14 ENCOUNTER — Emergency Department (HOSPITAL_COMMUNITY)
Admission: EM | Admit: 2022-03-14 | Discharge: 2022-03-14 | Disposition: A | Discharge status: Home / Self Care | Payer: Medicaid Other | Attending: Emergency Medicine | Admitting: Emergency Medicine

## 2022-03-14 ENCOUNTER — Encounter (HOSPITAL_COMMUNITY): Payer: Self-pay | Admitting: Pharmacy Technician

## 2022-03-14 DIAGNOSIS — D72829 Elevated white blood cell count, unspecified: Secondary | ICD-10-CM | POA: Diagnosis not present

## 2022-03-14 DIAGNOSIS — E876 Hypokalemia: Secondary | ICD-10-CM | POA: Diagnosis not present

## 2022-03-14 DIAGNOSIS — I10 Essential (primary) hypertension: Secondary | ICD-10-CM | POA: Insufficient documentation

## 2022-03-14 DIAGNOSIS — Z79899 Other long term (current) drug therapy: Secondary | ICD-10-CM | POA: Diagnosis not present

## 2022-03-14 DIAGNOSIS — R112 Nausea with vomiting, unspecified: Secondary | ICD-10-CM | POA: Diagnosis present

## 2022-03-14 DIAGNOSIS — Z20822 Contact with and (suspected) exposure to covid-19: Secondary | ICD-10-CM | POA: Insufficient documentation

## 2022-03-14 DIAGNOSIS — K2900 Acute gastritis without bleeding: Secondary | ICD-10-CM | POA: Diagnosis not present

## 2022-03-14 DIAGNOSIS — R1013 Epigastric pain: Secondary | ICD-10-CM

## 2022-03-14 LAB — COMPREHENSIVE METABOLIC PANEL
ALT: 30 U/L (ref 0–44)
AST: 26 U/L (ref 15–41)
Albumin: 3.6 g/dL (ref 3.5–5.0)
Alkaline Phosphatase: 76 U/L (ref 38–126)
Anion gap: 10 (ref 5–15)
BUN: 9 mg/dL (ref 6–20)
CO2: 21 mmol/L — ABNORMAL LOW (ref 22–32)
Calcium: 9.2 mg/dL (ref 8.9–10.3)
Chloride: 106 mmol/L (ref 98–111)
Creatinine, Ser: 0.86 mg/dL (ref 0.44–1.00)
GFR, Estimated: >60 mL/min (ref >60)
Glucose, Bld: 113 mg/dL — ABNORMAL HIGH (ref 70–99)
Potassium: 3.3 mmol/L — ABNORMAL LOW (ref 3.5–5.1)
Sodium: 137 mmol/L (ref 135–145)
Total Bilirubin: 0.5 mg/dL (ref 0.3–1.2)
Total Protein: 7.9 g/dL (ref 6.5–8.1)

## 2022-03-14 LAB — I-STAT BETA HCG BLOOD, ED (MC, WL, AP ONLY): I-stat hCG, quantitative: <5 m[IU]/mL (ref <5)

## 2022-03-14 LAB — CBC WITH DIFFERENTIAL/PLATELET
Abs Immature Granulocytes: 0 10*3/uL (ref 0.00–0.07)
Basophils Absolute: 0 10*3/uL (ref 0.0–0.1)
Basophils Relative: 0 %
Eosinophils Absolute: 0.1 10*3/uL (ref 0.0–0.5)
Eosinophils Relative: 1 %
HCT: 40.3 % (ref 36.0–46.0)
Hemoglobin: 14 g/dL (ref 12.0–15.0)
Lymphocytes Relative: 44 %
Lymphs Abs: 5.3 10*3/uL — ABNORMAL HIGH (ref 0.7–4.0)
MCH: 27.1 pg (ref 26.0–34.0)
MCHC: 34.7 g/dL (ref 30.0–36.0)
MCV: 77.9 fL — ABNORMAL LOW (ref 80.0–100.0)
Monocytes Absolute: 0.6 10*3/uL (ref 0.1–1.0)
Monocytes Relative: 5 %
Neutro Abs: 6.1 10*3/uL (ref 1.7–7.7)
Neutrophils Relative %: 50 %
Platelets: 339 10*3/uL (ref 150–400)
RBC: 5.17 MIL/uL — ABNORMAL HIGH (ref 3.87–5.11)
RDW: 12.7 % (ref 11.5–15.5)
WBC: 12.1 10*3/uL — ABNORMAL HIGH (ref 4.0–10.5)
nRBC: 0 % (ref 0.0–0.2)
nRBC: 0 /100 WBC

## 2022-03-14 LAB — URINALYSIS, ROUTINE W REFLEX MICROSCOPIC
Bacteria, UA: NONE SEEN
Bilirubin Urine: NEGATIVE
Glucose, UA: NEGATIVE mg/dL
Hgb urine dipstick: NEGATIVE
Ketones, ur: NEGATIVE mg/dL
Leukocytes,Ua: NEGATIVE
Nitrite: NEGATIVE
Protein, ur: 30 mg/dL — AB
Specific Gravity, Urine: 1.028 (ref 1.005–1.030)
pH: 5 (ref 5.0–8.0)

## 2022-03-14 LAB — RESP PANEL BY RT-PCR (RSV, FLU A&B, COVID)  RVPGX2
Influenza A by PCR: NEGATIVE
Influenza B by PCR: NEGATIVE
Resp Syncytial Virus by PCR: NEGATIVE
SARS Coronavirus 2 by RT PCR: NEGATIVE

## 2022-03-14 LAB — LIPASE, BLOOD: Lipase: 27 U/L (ref 11–51)

## 2022-03-14 MED ORDER — LIDOCAINE VISCOUS HCL 2 % MT SOLN
15.0000 mL | Freq: Once | OROMUCOSAL | Status: AC
  Administered 2022-03-14: 15 mL via ORAL
  Filled 2022-03-14: qty 15

## 2022-03-14 MED ORDER — MORPHINE SULFATE (PF) 4 MG/ML IV SOLN
4.0000 mg | Freq: Once | INTRAVENOUS | Status: AC
  Administered 2022-03-14: 4 mg via INTRAVENOUS
  Filled 2022-03-14: qty 1

## 2022-03-14 MED ORDER — SUCRALFATE 1 G PO TABS: 1.0000 g | ORAL_TABLET | Freq: Three times a day (TID) | ORAL | 0 refills | Status: DC | PRN

## 2022-03-14 MED ORDER — PANTOPRAZOLE SODIUM 40 MG PO TBEC: 40.0000 mg | DELAYED_RELEASE_TABLET | Freq: Every day | ORAL | 1 refills | Status: DC

## 2022-03-14 MED ORDER — ONDANSETRON 4 MG PO TBDP
4.0000 mg | ORAL_TABLET | Freq: Once | ORAL | Status: AC
  Administered 2022-03-14: 4 mg via ORAL
  Filled 2022-03-14: qty 1

## 2022-03-14 MED ORDER — ALUM & MAG HYDROXIDE-SIMETH 200-200-20 MG/5ML PO SUSP
30.0000 mL | Freq: Once | ORAL | Status: AC
  Administered 2022-03-14: 30 mL via ORAL
  Filled 2022-03-14: qty 30

## 2022-03-14 MED ORDER — ONDANSETRON HCL 4 MG PO TABS: 4.0000 mg | ORAL_TABLET | Freq: Four times a day (QID) | ORAL | 0 refills | Status: DC | PRN

## 2022-03-14 MED ORDER — ACETAMINOPHEN 325 MG PO TABS
650.0000 mg | ORAL_TABLET | Freq: Once | ORAL | Status: AC | PRN
  Administered 2022-03-14: 650 mg via ORAL
  Filled 2022-03-14: qty 2

## 2022-03-14 NOTE — ED Provider Triage Note (Signed)
Emergency Medicine Provider Triage Evaluation Note  Miranda Stewart , a 37 y.o. female  was evaluated in triage.  History of IBS. Pt complains of nausea, vomiting, diarrhea, LUQ pain for one week. Unable to tolerate oral intake without vomiting.  Review of Systems  Positive: Abdominal pain, nausea, vomiting, chills Negative: fever  Physical Exam  BP (!) 165/96   Pulse 63   Temp 99 F (37.2 C)   Resp 18   SpO2 100%  Gen:   Awake, no distress   Resp:  Normal effort  MSK:   Moves extremities without difficulty  Other:  Diffuse LUQ tenderness  Medical Decision Making  Medically screening exam initiated at 5:44 PM.  Appropriate orders placed.  Miranda Stewart was informed that the remainder of the evaluation will be completed by another provider, this initial triage assessment does not replace that evaluation, and the importance of remaining in the ED until their evaluation is complete.     Miranda Quill, NP 03/14/22 1751

## 2022-03-14 NOTE — Discharge Instructions (Addendum)
Please begin taking the Protonix every day to help decrease acid production in the stomach, I have attached some information on food choices that may help with acid reflux disease, you can use the Carafate as needed it with meals to decrease abdominal pain if, as I suspect, your pain is due to gastric ulcers.  Please follow-up with GI as discussed above and return to the emergency department for further evaluation if you have severe worsening of pain or no improvement despite treatment.

## 2022-03-14 NOTE — ED Provider Notes (Addendum)
[facility] Provider Note   CSN: [id number] Arrival date & time: 03/14/22  1710     History  Chief Complaint  Patient presents with   Abdominal Pain    Miranda Stewart is a 37 y.o. female with past medical history significant for hypertension, obesity, prediabetes with previous history of GERD, gastric ulcer who presents with concern for nausea, vomiting, diarrhea, abdominal pain since Thursday.  Patient reports some intermittent chills.  She reports pain is worse with eating.  Patient points to her stomach when asked where the pain is located.  She denies any hematemesis, hematochezia.  She reports that she is no longer taking any PPI or other GERD medication.   Abdominal Pain      Home Medications Prior to Admission medications   Medication Sig Start Date End Date Taking? Authorizing Provider  ondansetron (ZOFRAN) 4 MG tablet Take 1 tablet (4 mg total) by mouth every 6 (six) hours as needed for nausea or vomiting. 03/14/22  Yes Amiree No H, PA-C  pantoprazole (PROTONIX) 40 MG tablet Take 1 tablet (40 mg total) by mouth daily. 03/14/22  Yes Kadelyn Dimascio H, PA-C  sucralfate (CARAFATE) 1 g tablet Take 1 tablet (1 g total) by mouth 3 (three) times daily as needed (with meals for stomach pain). 03/14/22  Yes Marixa Mellott H, PA-C  Cholecalciferol (VITAMIN D3) 1.25 MG (50000 UT) CAPS TAKE 1 CAPSULE BY MOUTH ONE TIME PER WEEK 02/10/22   Janith Lima, MD  etonogestrel (NEXPLANON) 68 MG IMPL implant 1 each (68 mg total) by Subdermal route. 07/10/21   Janith Lima, MD  labetalol (NORMODYNE) 100 MG tablet Take 1 tablet (100 mg total) by mouth 2 (two) times daily. 09/24/21   Janith Lima, MD  lubiprostone (AMITIZA) 24 MCG capsule Take 1 capsule (24 mcg total) by mouth 2 (two) times daily with a meal. 09/24/21   Janith Lima, MD  methylPREDNISolone (MEDROL DOSEPAK) 4 MG TBPK tablet follow package directions 11/10/21    Charlett Blake, MD  phentermine (ADIPEX-P) 37.5 MG tablet TAKE 1 TABLET BY MOUTH DAILY BEFORE BREAKFAST 01/22/22   Janith Lima, MD      Allergies    Doxycycline    Review of Systems   Review of Systems  Gastrointestinal:  Positive for abdominal pain.  All other systems reviewed and are negative.   Physical Exam Updated Vital Signs BP (!) 162/104   Pulse (!) 58   Temp (!) 100.7 F (38.2 C) (Oral)   Resp 18   SpO2 100%  Physical Exam Vitals and nursing note reviewed.  Constitutional:      General: She is not in acute distress.    Appearance: Normal appearance. She is obese.  HENT:     Head: Normocephalic and atraumatic.  Eyes:     General:        Right eye: No discharge.        Left eye: No discharge.  Cardiovascular:     Rate and Rhythm: Normal rate and regular rhythm.     Heart sounds: No murmur heard.    No friction rub. No gallop.  Pulmonary:     Effort: Pulmonary effort is normal.     Breath sounds: Normal breath sounds.  Abdominal:     General: Bowel sounds are normal.     Palpations: Abdomen is soft.     Comments: Patient with focal tenderness to palpation in the epigastric region, with no  rebound, rigidity, guarding, no tenderness in the lower abdomen, no right upper quadrant tenderness or Murphy sign.  No rigidity or distention noted.  Skin:    General: Skin is warm and dry.     Capillary Refill: Capillary refill takes less than 2 seconds.  Neurological:     Mental Status: She is alert and oriented to person, place, and time.  Psychiatric:        Mood and Affect: Mood normal.        Behavior: Behavior normal.     ED Results / Procedures / Treatments   Labs (all labs ordered are listed, but only abnormal results are displayed) Labs Reviewed  COMPREHENSIVE METABOLIC PANEL - Abnormal; Notable for the following components:      Result Value   Potassium 3.3 (*)    CO2 21 (*)    Glucose, Bld 113 (*)    All other components within normal limits   CBC WITH DIFFERENTIAL/PLATELET - Abnormal; Notable for the following components:   WBC 12.1 (*)    RBC 5.17 (*)    MCV 77.9 (*)    Lymphs Abs 5.3 (*)    All other components within normal limits  URINALYSIS, ROUTINE W REFLEX MICROSCOPIC - Abnormal; Notable for the following components:   Color, Urine AMBER (*)    APPearance CLOUDY (*)    Protein, ur 30 (*)    All other components within normal limits  RESP PANEL BY RT-PCR (RSV, FLU A&B, COVID)  RVPGX2  LIPASE, BLOOD  I-STAT BETA HCG BLOOD, ED ([facility], [facility], [facility] ONLY)    EKG EKG Interpretation  Date/Time:  Sunday March 14 2022 18:58:39 EST Ventricular Rate:  56 PR Interval:  114 QRS Duration: 99 QT Interval:  458 QTC Calculation: 442 R Axis:   20 Text Interpretation: Sinus rhythm Borderline short PR interval Left ventricular hypertrophy new t wave inversion 3 form prior 5/18 Confirmed by Aletta Edouard [phone]) on 03/14/2022 7:01:17 PM  Radiology No results found.  Procedures Procedures    Medications Ordered in ED Medications  acetaminophen (TYLENOL) tablet 650 mg (has no administration in time range)  morphine (PF) 4 MG/ML injection 4 mg (4 mg Intravenous Given 03/14/22 1940)  ondansetron (ZOFRAN-ODT) disintegrating tablet 4 mg (4 mg Oral Given 03/14/22 1938)  alum & mag hydroxide-simeth (MAALOX/MYLANTA) 200-200-20 MG/5ML suspension 30 mL (30 mLs Oral Given 03/14/22 1938)    And  lidocaine (XYLOCAINE) 2 % viscous mouth solution 15 mL (15 mLs Oral Given 03/14/22 1938)    ED Course/ Medical Decision Making/ A&P                             Medical Decision Making Amount and/or Complexity of Data Reviewed Labs: ordered.  Risk OTC drugs. Prescription drug management.   This patient is a 37 y.o. female  who presents to the ED for concern of epigastric abdominal pain.   Differential diagnoses prior to evaluation: The emergent differential diagnosis includes, but is not limited to,  esophagitis, gastritis, peptic ulcer  disease, esophageal rupture, gastric rupture, Boerhaave's, Mallory-Weiss, pancreatitis, cholecystitis, cholangitis, acute mesenteric ischemia, atypical chest pain or ACS, lower lobar pneumonia versus other . This is not an exhaustive differential.   Past Medical History / Co-morbidities: Obesity, GERD, hypertension  Additional history: Chart reviewed. Pertinent results include: Reviewed outpatient family medicine visits  Physical Exam: Physical exam performed. The pertinent findings include: Patient with some tenderness in the epigastric region without rebound,  rigidity, guarding.  No other focal tenderness throughout the abdomen, no distention or ecchymosis noted.  Vital signs are overall stable, she has mildly elevated temperature at 99 degrees, stable oxygen saturation on room air, normal respiratory rate, no tachycardia.  She is moderately hypertensive with blood pressure 162/104.  Lab Tests/Imaging studies: I personally interpreted labs/imaging and the pertinent results include: CMP notable for very mild hypokalemia, testing 3.3, we will orally replete and encourage oral intake.  Her CBC is notable for very mild leukocytosis, white blood cells 12.1.  Her UA is overall unremarkable, lipase unremarkable, negative pregnancy test.  Cardiac monitoring: EKG obtained and interpreted by my attending physician which shows: Normal sinus rhythm, new T wave inversions from previous, but no evidence of ischemic heart disease based on patient's reported complaint, her epigastric pain is nonexertional in nature.  No active chest pain on my exam.   Medications: I ordered medication including morphine, Maalox, Mylanta, viscous lidocaine, Zofran for epigastric abdominal pain with suspicion of gastric ulcer or gastritis.  Patient with improvement of pain after GI cocktail further adding to suspicion of gastric origin of her pain..  I have reviewed the patients home medicines and have made adjustments as  needed.   Disposition: After consideration of the diagnostic results and the patients response to treatment, I feel that patient's pain is consistent with gastritis, gastroenteritis, peptic ulcer disease, acid reflux versus other, no evidence of emergent or surgical intra-abdominal abnormality at this time.  Plan to discharge with Protonix, Carafate, and encouraged GI follow-up.   emergency department workup does not suggest an emergent condition requiring admission or immediate intervention beyond what has been performed at this time. The plan is: as above. The patient is safe for discharge and has been instructed to return immediately for worsening symptoms, change in symptoms or any other concerns.  Patient with developing fever on reassessment which raises my overall suspicion for a viral gastroenteritis as the etiology for her symptoms at this time, she remains with a benign abdominal exam, soft, with only minimal tenderness, encourage plenty of fluids, rest, and PCP recheck if any concern for ongoing symptoms. Final Clinical Impression(s) / ED Diagnoses Final diagnoses:  Epigastric abdominal pain  Acute gastritis without hemorrhage, unspecified gastritis type    Rx / DC Orders ED Discharge Orders          Ordered    pantoprazole (PROTONIX) 40 MG tablet  Daily        03/14/22 2011    sucralfate (CARAFATE) 1 g tablet  3 times daily PRN        03/14/22 2011    ondansetron (ZOFRAN) 4 MG tablet  Every 6 hours PRN        03/14/22 2011              Anselmo Pickler, PA-C 03/14/22 2012    Anselmo Pickler, PA-C 03/14/22 2030    Hayden Rasmussen, MD 03/15/22 1038

## 2022-03-14 NOTE — ED Triage Notes (Signed)
Pt here with reports of NVD and abdominal pain since Thursday. Endorses chills as well.

## 2022-03-14 NOTE — ED Notes (Signed)
Notified Christian PA of fever prior to D/C. To admin PO tylenol still ok for d/c according to PA.

## 2022-03-15 ENCOUNTER — Encounter: Payer: Self-pay | Admitting: Internal Medicine

## 2022-03-16 ENCOUNTER — Encounter: Payer: Medicaid Other | Attending: Physical Medicine & Rehabilitation | Admitting: Physical Medicine & Rehabilitation

## 2022-03-18 ENCOUNTER — Ambulatory Visit (INDEPENDENT_AMBULATORY_CARE_PROVIDER_SITE_OTHER): Payer: Medicaid Other | Admitting: Internal Medicine

## 2022-03-18 ENCOUNTER — Encounter: Payer: Self-pay | Admitting: Internal Medicine

## 2022-03-18 VITALS — BP 126/84 | HR 87 | Temp 98.2°F | Ht 68.0 in | Wt 322.0 lb

## 2022-03-18 DIAGNOSIS — R10812 Left upper quadrant abdominal tenderness: Secondary | ICD-10-CM | POA: Diagnosis not present

## 2022-03-18 DIAGNOSIS — R7303 Prediabetes: Secondary | ICD-10-CM

## 2022-03-18 DIAGNOSIS — E876 Hypokalemia: Secondary | ICD-10-CM | POA: Diagnosis not present

## 2022-03-18 DIAGNOSIS — I10 Essential (primary) hypertension: Secondary | ICD-10-CM

## 2022-03-18 DIAGNOSIS — K279 Peptic ulcer, site unspecified, unspecified as acute or chronic, without hemorrhage or perforation: Secondary | ICD-10-CM

## 2022-03-18 LAB — BASIC METABOLIC PANEL
BUN: 10 mg/dL (ref 6–23)
CO2: 29 mEq/L (ref 19–32)
Calcium: 9.4 mg/dL (ref 8.4–10.5)
Chloride: 102 mEq/L (ref 96–112)
Creatinine, Ser: 0.88 mg/dL (ref 0.40–1.20)
GFR: 84.49 mL/min (ref >60.00)
Glucose, Bld: 112 mg/dL — ABNORMAL HIGH (ref 70–99)
Potassium: 3.4 mEq/L — ABNORMAL LOW (ref 3.5–5.1)
Sodium: 139 mEq/L (ref 135–145)

## 2022-03-18 LAB — CBC WITH DIFFERENTIAL/PLATELET
Basophils Absolute: 0 10*3/uL (ref 0.0–0.1)
Basophils Relative: 0.3 % (ref 0.0–3.0)
Eosinophils Absolute: 0 10*3/uL (ref 0.0–0.7)
Eosinophils Relative: 0.5 % (ref 0.0–5.0)
HCT: 40.7 % (ref 36.0–46.0)
Hemoglobin: 14 g/dL (ref 12.0–15.0)
Lymphocytes Relative: 34.2 % (ref 12.0–46.0)
Lymphs Abs: 2.8 10*3/uL (ref 0.7–4.0)
MCHC: 34.3 g/dL (ref 30.0–36.0)
MCV: 79.8 fl (ref 78.0–100.0)
Monocytes Absolute: 0.9 10*3/uL (ref 0.1–1.0)
Monocytes Relative: 10.3 % (ref 3.0–12.0)
Neutro Abs: 4.6 10*3/uL (ref 1.4–7.7)
Neutrophils Relative %: 54.7 % (ref 43.0–77.0)
Platelets: 289 10*3/uL (ref 150.0–400.0)
RBC: 5.1 Mil/uL (ref 3.87–5.11)
RDW: 13.3 % (ref 11.5–15.5)
WBC: 8.3 10*3/uL (ref 4.0–10.5)

## 2022-03-18 LAB — LIPASE: Lipase: 16 U/L (ref 11.0–59.0)

## 2022-03-18 LAB — HEMOGLOBIN A1C: Hgb A1c MFr Bld: 6.1 % (ref 4.6–6.5)

## 2022-03-18 LAB — MAGNESIUM: Magnesium: 1.9 mg/dL (ref 1.5–2.5)

## 2022-03-18 MED ORDER — POTASSIUM CHLORIDE ER 10 MEQ PO TBCR: 10.0000 meq | EXTENDED_RELEASE_TABLET | Freq: Two times a day (BID) | ORAL | 0 refills | Status: DC

## 2022-03-18 NOTE — Patient Instructions (Signed)

## 2022-03-18 NOTE — Progress Notes (Signed)
Subjective:  Patient ID: Miranda Stewart, female    DOB: 12/23/1985  Age: 37 y.o. MRN: [id number]  CC: Abdominal Pain   HPI Miranda Stewart presents for f/up -  She was seen in the ED about a week ago for nausea, vomiting, diarrhea, night sweats, and abdominal pain.  She tells me that most of her symptoms have resolved but she continues to have discomfort in her left upper abdomen.  She says this is reminiscent of 9 years ago when she had a stomach ulcer.  Outpatient Medications Prior to Visit  Medication Sig Dispense Refill  . Cholecalciferol (VITAMIN D3) 1.25 MG (50000 UT) CAPS TAKE 1 CAPSULE BY MOUTH ONE TIME PER WEEK 12 capsule 0  . etonogestrel (NEXPLANON) 68 MG IMPL implant 1 each (68 mg total) by Subdermal route. 1 each 0  . labetalol (NORMODYNE) 100 MG tablet Take 1 tablet (100 mg total) by mouth 2 (two) times daily. 180 tablet 1  . lubiprostone (AMITIZA) 24 MCG capsule Take 1 capsule (24 mcg total) by mouth 2 (two) times daily with a meal. 180 capsule 1  . ondansetron (ZOFRAN) 4 MG tablet Take 1 tablet (4 mg total) by mouth every 6 (six) hours as needed for nausea or vomiting. 15 tablet 0  . pantoprazole (PROTONIX) 40 MG tablet Take 1 tablet (40 mg total) by mouth daily. 30 tablet 1  . phentermine (ADIPEX-P) 37.5 MG tablet TAKE 1 TABLET BY MOUTH DAILY BEFORE BREAKFAST 90 tablet 0  . sucralfate (CARAFATE) 1 g tablet Take 1 tablet (1 g total) by mouth 3 (three) times daily as needed (with meals for stomach pain). 60 tablet 0  . methylPREDNISolone (MEDROL DOSEPAK) 4 MG TBPK tablet follow package directions 21 tablet 0   No facility-administered medications prior to visit.    ROS Review of Systems  Constitutional:  Positive for unexpected weight change (wt loss). Negative for chills, diaphoresis and fatigue.  HENT: Negative.    Eyes: Negative.   Respiratory:  Negative for cough, chest tightness, shortness of breath and wheezing.   Cardiovascular:  Negative for chest pain,  palpitations and leg swelling.  Gastrointestinal:  Positive for abdominal pain and blood in stool. Negative for abdominal distention, constipation, diarrhea, nausea and vomiting.       LUQ abd pain for 9 years  Endocrine: Negative.   Genitourinary: Negative.   Musculoskeletal: Negative.   Skin: Negative.   Neurological:  Negative for dizziness and weakness.  Hematological:  Negative for adenopathy. Does not bruise/bleed easily.  Psychiatric/Behavioral: Negative.      Objective:  BP 126/84 (BP Location: Right Arm, Patient Position: Sitting, Cuff Size: Large)   Pulse 87   Temp 98.2 F (36.8 C) (Oral)   Ht '5\' 8"'$  (1.727 m)   Wt (!) 322 lb (146.1 kg)   SpO2 97%   BMI 48.96 kg/m   BP Readings from Last 3 Encounters:  03/18/22 126/84  03/14/22 (!) 144/74  11/10/21 116/88    Wt Readings from Last 3 Encounters:  03/18/22 (!) 322 lb (146.1 kg)  11/10/21 (!) 355 lb 12.8 oz (161.4 kg)  09/24/21 (!) 367 lb (166.5 kg)    Physical Exam Vitals reviewed. Exam conducted with a chaperone present (Shirron).  Constitutional:      Appearance: She is obese. She is not ill-appearing.  Eyes:     General: No scleral icterus.    Pupils: Pupils are equal, round, and reactive to light.  Cardiovascular:     Rate and Rhythm:  Normal rate and regular rhythm.     Heart sounds: No murmur heard.    No gallop.  Pulmonary:     Effort: Pulmonary effort is normal.     Breath sounds: No stridor. No wheezing, rhonchi or rales.  Abdominal:     General: Abdomen is protuberant. Bowel sounds are normal. There is no distension.     Palpations: There is no hepatomegaly, splenomegaly or mass.     Tenderness: There is abdominal tenderness in the left upper quadrant. There is no guarding or rebound.     Hernia: No hernia is present.  Genitourinary:    Rectum: Normal. Guaiac result positive. No mass, tenderness, anal fissure, external hemorrhoid or internal hemorrhoid. Normal anal tone.  Musculoskeletal:         General: Normal range of motion.     Cervical back: Neck supple.  Lymphadenopathy:     Cervical: No cervical adenopathy.  Skin:    General: Skin is warm and dry.     Coloration: Skin is not pale.     Findings: No rash.  Neurological:     General: No focal deficit present.     Mental Status: She is alert. Mental status is at baseline.  Psychiatric:        Mood and Affect: Mood normal.        Behavior: Behavior normal.    Lab Results  Component Value Date   WBC 8.3 03/18/2022   HGB 14.0 03/18/2022   HCT 40.7 03/18/2022   PLT 289.0 03/18/2022   GLUCOSE 112 (H) 03/18/2022   CHOL 163 06/25/2021   TRIG 80.0 06/25/2021   HDL 32.20 (L) 06/25/2021   LDLCALC 115 (H) 06/25/2021   ALT 30 03/14/2022   AST 26 03/14/2022   NA 139 03/18/2022   K 3.4 (L) 03/18/2022   CL 102 03/18/2022   CREATININE 0.88 03/18/2022   BUN 10 03/18/2022   CO2 29 03/18/2022   TSH 1.65 06/25/2021   HGBA1C 6.1 03/18/2022    No results found.  Assessment & Plan:   Miranda Stewart was seen today for abdominal pain.  Diagnoses and all orders for this visit:  Primary hypertension- Her blood pressure is adequately well-controlled.  I will treat the hypokalemia. -     Basic metabolic panel; Future -     Magnesium; Future -     CBC with Differential/Platelet; Future -     CBC with Differential/Platelet -     Magnesium -     Basic metabolic panel -     potassium chloride (KLOR-CON 10) 10 MEQ tablet; Take 1 tablet (10 mEq total) by mouth 2 (two) times daily.  Prediabetes -     Hemoglobin A1c; Future -     Hemoglobin A1c  Hypokalemia -     Basic metabolic panel; Future -     Magnesium; Future -     Magnesium -     Basic metabolic panel -     potassium chloride (KLOR-CON 10) 10 MEQ tablet; Take 1 tablet (10 mEq total) by mouth 2 (two) times daily.  Left upper quadrant abdominal tenderness without rebound tenderness- Labs are reassuring.  I recommended that she consider undergoing EGD to evaluate for  recurrent ulcer. -     Lipase; Future -     CBC with Differential/Platelet; Future -     CBC with Differential/Platelet -     Lipase  PUD (peptic ulcer disease)- Will continue the PPI. -     Ambulatory referral  to Gastroenterology   I have discontinued Miranda Stewart's methylPREDNISolone. I am also having her start on potassium chloride. Additionally, I am having her maintain her lubiprostone, etonogestrel, labetalol, phentermine, Vitamin D3, pantoprazole, sucralfate, and ondansetron.  Meds ordered this encounter  Medications  . potassium chloride (KLOR-CON 10) 10 MEQ tablet    Sig: Take 1 tablet (10 mEq total) by mouth 2 (two) times daily.    Dispense:  180 tablet    Refill:  0     Follow-up: Return in about 6 weeks (around 04/29/2022).  [name], MD

## 2022-03-22 ENCOUNTER — Other Ambulatory Visit: Payer: Self-pay | Admitting: Internal Medicine

## 2022-03-29 ENCOUNTER — Ambulatory Visit: Payer: Medicaid Other | Admitting: Internal Medicine

## 2022-04-02 ENCOUNTER — Encounter: Payer: Self-pay | Admitting: Gastroenterology

## 2022-05-04 ENCOUNTER — Ambulatory Visit: Payer: Medicaid Other | Admitting: Internal Medicine

## 2022-05-05 NOTE — Progress Notes (Addendum)
[facility] Consult Note:  History: Miranda Stewart 05/11/2022  Referring provider: Janith Lima, MD  Reason for consult/chief complaint: Gastroesophageal Reflux (Intermittent Sulfur burp with acid reflux, taking protonix daily.) and Abdominal Pain (Since 2016, intermittent LUQ pain with N/V, chills, constipation and diarrhea)   Subjective  HPI:Pt has history significant for hypertension, obesity, prediabetes with previous history of GERD, and gastric ulcer. Pt was seen in ED on 03/14/22. Per that note, she was complaining of nausea, vomiting, diarrhea, and abdominal pain for the past few days. She was treated with Tylenol, Zofran, and Mylanta.      Patient presents to clinic today for an evaluation of abdominal pain per the request of Dr. [name].   Today, she recalls back in 2016, she had an episode of excessive drinking that resulted in an ED visit where she was treated for a stomach ulcer. She reports that when she got pregnant in 2019, she was taken off her Protonix. She reports that she would experience LUQ pain when eating and her pain would persist for few days. She reports constipation diarrhea, nausea,vomiting, chills, and unable to eat or drink when having her pain episodes.  Today, she reports experiencing sulfur burp and acid reflux. She reported cutting soda and other acidic food such as tomatoes from her diet. She states she would occasionally experience back pain when having her LUQ pain episodes. She was prescribed Carafate for her pain and has seen improvements with it.  Prior to pregnancy, her typical bowel pattern was the need for a BM soon after meals.  Things change during pregnancy, where she developed constipation, and states she had no BM for the last 6 months of pregnancy.  That constipation has continued, including up to the time of ED visit in January.  However,she reports having improved BM while taking Amitiza 24 mcg.  She now typically  will have a BM once a day or once every other day. She denies any blood in stool.   She reports having constipation problems in her childhood and having to take fibers.    She reports being an occasional smoker, she reports quitting 2 months ago. She reported smoking marijuana prior to her pregnancy.   Patient reports maternal aunt stomach ulcer. She denies any family history of colon cancer, rectal cancer, or crohn's disease.   ROS: Review of Systems  Constitutional:  Positive for chills and unexpected weight change. Negative for appetite change and fever.  HENT:  Negative for trouble swallowing.   Respiratory:  Negative for cough and shortness of breath.   Cardiovascular:  Negative for chest pain.  Gastrointestinal:  Positive for abdominal pain, constipation, nausea and vomiting. Negative for abdominal distention, anal bleeding, blood in stool, diarrhea and rectal pain.  Genitourinary:  Negative for dysuria.  Musculoskeletal:  Positive for back pain.  Skin:  Negative for rash.  Neurological:  Negative for weakness.  All other systems reviewed and are negative.    Past Medical History: Past Medical History:  Diagnosis Date   Anxiety    Benign essential HTN    Depression    Dermoid cyst    per pt, left ovary   Family history of cardiomyopathy 05/30/2018   Migraines    Obesity    Polycystic ovarian disease    Postpartum state    Seasonal allergies    Vaginal Pap smear, abnormal      Past Surgical History: Past Surgical History:  Procedure Laterality Date   ADENOIDECTOMY  CESAREAN SECTION MULTI-GESTATIONAL N/A 01/04/2018   Procedure: CESAREAN SECTION MULTI-GESTATIONAL;  Surgeon: Allyn Kenner, DO;  Location: [facility];  Service: Obstetrics;  Laterality: N/A;   TONSILLECTOMY     UNILATERAL SALPINGECTOMY Left 01/04/2018   Procedure: UNILATERAL SALPINGECTOMY;  Surgeon: Allyn Kenner, DO;  Location: [facility];  Service: Obstetrics;  Laterality:  Left;     Family History: Family History  Problem Relation Age of Onset   Heart disease Mother    Diabetes Mellitus I Maternal Grandmother    Lupus Maternal Grandmother    Diabetes Mellitus I Paternal Grandmother    Lupus Maternal Uncle    Colon cancer Neg Hx    Stomach cancer Neg Hx    Esophageal cancer Neg Hx    Liver cancer Neg Hx    Pancreatic cancer Neg Hx    Rectal cancer Neg Hx     Social History: Social History   Socioeconomic History   Marital status: Single    Spouse name: Not on file   Number of children: 2   Years of education: Not on file   Highest education level: Not on file  Occupational History   Occupation: Customer Service  Tobacco Use   Smoking status: Former    Packs/day: .25    Types: Cigarettes    Passive exposure: Never   Smokeless tobacco: Never  Vaping Use   Vaping Use: Never used  Substance and Sexual Activity   Alcohol use: Not Currently    Comment: occ   Drug use: No   Sexual activity: Yes    Partners: Male    Birth control/protection: Implant  Other Topics Concern   Not on file  Social History Narrative   Not on file   Social Determinants of Health   Financial Resource Strain: Not on file  Food Insecurity: Not on file  Transportation Needs: Not on file  Physical Activity: Not on file  Stress: Not on file  Social Connections: Not on file    Allergies: Allergies  Allergen Reactions   Doxycycline Nausea And Vomiting    (patient notes that only the tablets have caused this)    Outpatient Meds: Current Outpatient Medications  Medication Sig Dispense Refill   Cholecalciferol (VITAMIN D3) 1.25 MG (50000 UT) CAPS TAKE 1 CAPSULE BY MOUTH ONE TIME PER WEEK 12 capsule 0   etonogestrel (NEXPLANON) 68 MG IMPL implant 1 each (68 mg total) by Subdermal route. 1 each 0   labetalol (NORMODYNE) 100 MG tablet Take 1 tablet (100 mg total) by mouth 2 (two) times daily. 180 tablet 1   lubiprostone (AMITIZA) 24 MCG capsule Take 1  capsule (24 mcg total) by mouth 2 (two) times daily with a meal. 180 capsule 1   ondansetron (ZOFRAN) 4 MG tablet Take 1 tablet (4 mg total) by mouth every 6 (six) hours as needed for nausea or vomiting. 15 tablet 0   pantoprazole (PROTONIX) 40 MG tablet Take 1 tablet (40 mg total) by mouth daily. 30 tablet 1   phentermine (ADIPEX-P) 37.5 MG tablet TAKE 1 TABLET BY MOUTH DAILY BEFORE BREAKFAST 90 tablet 0   potassium chloride (KLOR-CON 10) 10 MEQ tablet Take 1 tablet (10 mEq total) by mouth 2 (two) times daily. 180 tablet 0   sucralfate (CARAFATE) 1 g tablet Take 1 tablet (1 g total) by mouth 3 (three) times daily as needed (with meals for stomach pain). 60 tablet 0   No current facility-administered medications for this visit.  ___________________________________________________________________ Objective   Exam:  BP 126/70   Pulse 82   Ht 5\' 8"  (1.727 m)   Wt (!) 325 lb 6 oz (147.6 kg)   SpO2 98%   BMI 49.47 kg/m  Wt Readings from Last 3 Encounters:  05/11/22 (!) 325 lb 6 oz (147.6 kg)  03/18/22 (!) 322 lb (146.1 kg)  11/10/21 (!) 355 lb 12.8 oz (161.4 kg)    General: well-appearing  Eyes: sclera anicteric, no redness ENT: oral mucosa moist without lesions, no cervical or supraclavicular lymphadenopathy CV: RRR, no JVD, no peripheral edema Resp: clear to auscultation bilaterally, normal RR and effort noted GI: soft, obese, no tenderness, with active bowel sounds. No guarding or palpable organomegaly noted (limited by body habitus) Skin; warm and dry, no rash or jaundice noted Neuro: awake, alert and oriented x 3. Normal gross motor function and fluent speech  Labs:    Latest Ref Rng & Units 03/18/2022   10:07 AM 03/14/2022    5:51 PM 06/25/2021    8:45 AM  CBC  WBC 4.0 - 10.5 K/uL 8.3  12.1  8.1   Hemoglobin 12.0 - 15.0 g/dL 14.0  14.0  13.6   Hematocrit 36.0 - 46.0 % 40.7  40.3  41.1   Platelets 150.0 - 400.0 K/uL 289.0  339  284.0       Latest Ref Rng & Units  03/18/2022   10:07 AM 03/14/2022    5:51 PM 06/25/2021    8:45 AM  CMP  Glucose 70 - 99 mg/dL 112  113  118   BUN 6 - 23 mg/dL 10  9  11    Creatinine 0.40 - 1.20 mg/dL 0.88  0.86  0.81   Sodium 135 - 145 mEq/L 139  137  138   Potassium 3.5 - 5.1 mEq/L 3.4  3.3  4.3   Chloride 96 - 112 mEq/L 102  106  104   CO2 19 - 32 mEq/L 29  21  28    Calcium 8.4 - 10.5 mg/dL 9.4  9.2  9.1   Total Protein 6.5 - 8.1 g/dL  7.9  7.8   Total Bilirubin 0.3 - 1.2 mg/dL  0.5  0.4   Alkaline Phos 38 - 126 U/L  76  75   AST 15 - 41 U/L  26  18   ALT 0 - 44 U/L  30  18      Radiologic Studies: EKG Interpretation 03/14/22   Date/Time:                  Sunday March 14 2022 18:58:39 EST Ventricular Rate:         56 PR Interval:                 114 QRS Duration: 99 QT Interval:                 458 QTC Calculation:        442 R Axis:                         20  Text Interpretation:      Sinus rhythm Borderline short PR interval Left ventricular hypertrophy new t wave inversion 3 form prior 5/18 Confirmed by Aletta Edouard [id number]) on 03/14/2022 7:01:17 PM   TRANSABDOMINAL AND TRANSVAGINAL ULTRASOUND OF PELVIS 2018   TECHNIQUE: Both transabdominal and transvaginal ultrasound examinations of the pelvis were performed. Transabdominal technique was performed for global imaging of  the pelvis including uterus, ovaries, adnexal regions, and pelvic cul-de-sac. It was necessary to proceed with endovaginal exam following the transabdominal exam to visualize the endometrium, uterus and adnexa. Transabdominal images are limited by a inadequate bladder filling and suboptimal acoustic window. Transvaginal images are limited in assessment of the upper uterus.   COMPARISON:  None   FINDINGS: Uterus   Measurements: Approximately 8.7 x 3.0 x 4.1 cm. Suboptimally visualized. No gross evidence of intrauterine mass   Endometrium   Thickness: 5 mm thick, normal. No endometrial fluid or gross focal abnormalities    Right ovary   Measurements: 2.9 x 2.3 x 2.2 cm. Normal morphology without mass   Left ovary   Not definitively visualized.   Other findings   In the LEFT adnexa, and large shadowing soft tissue mass is seen extending to the midline posterior to the uterus, poorly defined, approximately 10.1 cm in diameter, uncertain if represents an ovarian or exophytic uterine mass. This is suboptimally visualized due to size on transvaginal imaging and due to bowel gas on transabdominal imaging. No definite free fluid.   IMPRESSION: Grossly unremarkable uterus and RIGHT ovary.   Nonvisualization of LEFT ovary with identification of a large posterior LEFT pelvic soft tissue mass approximately 10 cm in size, poorly delineated by this exam; recommend localization and characterization of this lesion by MR imaging of the pelvis with and without contrast to determine origin and significance.   These results will be called to the ordering clinician or representative by the Radiologist Assistant, and communication documented in the PACS or zVision Dashboard.   Assessment: Acute gastritis without hemorrhage, unspecified gastritis type - Plan: 0.9 %  sodium chloride infusion, Ambulatory referral to Gastroenterology  LUQ pain - Plan: 0.9 %  sodium chloride infusion, Ambulatory referral to Gastroenterology  Chronic constipation - Plan: 0.9 %  sodium chloride infusion, Ambulatory referral to Gastroenterology  Nausea and vomiting, unspecified vomiting type - Plan: 0.9 %  sodium chloride infusion, Ambulatory referral to Gastroenterology    Years of episodic crampy left upper quadrant pain with variable bowel habits.  Bowel pattern changed several years ago during pregnancy and now predominantly chronic constipation.  Pain is atypical location for biliary colic.  Its episodic nature is also not typical for ulcer, though she has been treated empirically for that as needed use of Carafate.  This may be  functional condition such as IBS since she also has irregular bowel habits.  She has been feels fairly well since starting Amitiza about 2 months ago. Question H. pylori, peptic ulcer, less likely neoplasia or inflammatory bowel disease.   Plan: Schedule an EGD.  She was agreeable after discussion of procedure and risks. The benefits and risks of the planned procedure were described in detail with the patient or (when appropriate) their health care proxy.  Risks were outlined as including, but not limited to, bleeding, infection, perforation, adverse medication reaction leading to cardiac or pulmonary decompensation, pancreatitis (if ERCP).  The limitation of incomplete mucosal visualization was also discussed.  No guarantees or warranties were given.  Patient at increased risk for cardiopulmonary complications of procedure due to medical comorbidities.  Procedure will be done in the hospital outpatient endoscopy department due to her BMI of 50.   Thank you for the courtesy of this consult.  Please call me with any questions or concerns.  Carleene Cooper III, MD, have reviewed all documentation for this visit. The documentation on 05/11/22 for the exam,  diagnosis, procedures, and orders are all accurate and complete.   CC: Referring provider noted above   I,Safa M Kadhim,acting as a scribe for [place], MD.,have documented all relevant documentation on the behalf of Doran Stabler, MD,as directed by  Doran Stabler, MD while in the presence of Doran Stabler, MD.

## 2022-05-11 ENCOUNTER — Ambulatory Visit (INDEPENDENT_AMBULATORY_CARE_PROVIDER_SITE_OTHER): Payer: Medicaid Other | Admitting: Gastroenterology

## 2022-05-11 ENCOUNTER — Encounter: Payer: Self-pay | Admitting: Gastroenterology

## 2022-05-11 VITALS — BP 126/70 | HR 82 | Ht 68.0 in | Wt 325.4 lb

## 2022-05-11 DIAGNOSIS — K2900 Acute gastritis without bleeding: Secondary | ICD-10-CM

## 2022-05-11 DIAGNOSIS — K5909 Other constipation: Secondary | ICD-10-CM | POA: Diagnosis not present

## 2022-05-11 DIAGNOSIS — R112 Nausea with vomiting, unspecified: Secondary | ICD-10-CM | POA: Diagnosis not present

## 2022-05-11 DIAGNOSIS — R1012 Left upper quadrant pain: Secondary | ICD-10-CM

## 2022-05-11 NOTE — Patient Instructions (Signed)
_______________________________________________________  If your blood pressure at your visit was 140/90 or greater, please contact your primary care physician to follow up on this.  _______________________________________________________  If you are age 37 or older, your body mass index should be between 23-30. Your Body mass index is 49.47 kg/m. If this is out of the aforementioned range listed, please consider follow up with your Primary Care Provider.  If you are age 52 or younger, your body mass index should be between 19-25. Your Body mass index is 49.47 kg/m. If this is out of the aformentioned range listed, please consider follow up with your Primary Care Provider.   ________________________________________________________  The [facility] providers would like to encourage you to use [facility] to communicate with providers for non-urgent requests or questions.  Due to long hold times on the telephone, sending your provider a message by [facility] may be a faster and more efficient way to get a response.  Please allow 48 business hours for a response.  Please remember that this is for non-urgent requests.  _______________________________________________________  Miranda Stewart have been scheduled for an endoscopy. Please follow written instructions given to you at your visit today. If you use inhalers (even only as needed), please bring them with you on the day of your procedure.  Please STOP phentermine for 7 days prior to your EGD.  It was a pleasure to see you today!  Thank you for trusting me with your gastrointestinal care!

## 2022-05-25 ENCOUNTER — Other Ambulatory Visit: Payer: Self-pay

## 2022-05-25 ENCOUNTER — Encounter (HOSPITAL_COMMUNITY)
Admission: RE | Disposition: A | Discharge status: Home / Self Care | Payer: Self-pay | Source: Home / Self Care | Attending: Gastroenterology

## 2022-05-25 ENCOUNTER — Ambulatory Visit (HOSPITAL_COMMUNITY): Payer: Medicaid Other | Admitting: Registered Nurse

## 2022-05-25 ENCOUNTER — Ambulatory Visit (HOSPITAL_COMMUNITY)
Admission: RE | Admit: 2022-05-25 | Discharge: 2022-05-25 | Disposition: A | Discharge status: Home / Self Care | Payer: Medicaid Other | Attending: Gastroenterology | Admitting: Gastroenterology

## 2022-05-25 ENCOUNTER — Ambulatory Visit (HOSPITAL_BASED_OUTPATIENT_CLINIC_OR_DEPARTMENT_OTHER): Payer: Medicaid Other | Admitting: Registered Nurse

## 2022-05-25 ENCOUNTER — Encounter (HOSPITAL_COMMUNITY): Payer: Self-pay | Admitting: Gastroenterology

## 2022-05-25 DIAGNOSIS — R112 Nausea with vomiting, unspecified: Secondary | ICD-10-CM

## 2022-05-25 DIAGNOSIS — Z87891 Personal history of nicotine dependence: Secondary | ICD-10-CM | POA: Insufficient documentation

## 2022-05-25 DIAGNOSIS — F32A Depression, unspecified: Secondary | ICD-10-CM | POA: Diagnosis not present

## 2022-05-25 DIAGNOSIS — F419 Anxiety disorder, unspecified: Secondary | ICD-10-CM | POA: Insufficient documentation

## 2022-05-25 DIAGNOSIS — F418 Other specified anxiety disorders: Secondary | ICD-10-CM | POA: Diagnosis not present

## 2022-05-25 DIAGNOSIS — R1012 Left upper quadrant pain: Secondary | ICD-10-CM

## 2022-05-25 DIAGNOSIS — K5909 Other constipation: Secondary | ICD-10-CM

## 2022-05-25 DIAGNOSIS — I10 Essential (primary) hypertension: Secondary | ICD-10-CM | POA: Diagnosis not present

## 2022-05-25 DIAGNOSIS — K219 Gastro-esophageal reflux disease without esophagitis: Secondary | ICD-10-CM | POA: Diagnosis not present

## 2022-05-25 DIAGNOSIS — Z79899 Other long term (current) drug therapy: Secondary | ICD-10-CM | POA: Diagnosis not present

## 2022-05-25 DIAGNOSIS — E282 Polycystic ovarian syndrome: Secondary | ICD-10-CM | POA: Diagnosis not present

## 2022-05-25 HISTORY — PX: BIOPSY: SHX5522

## 2022-05-25 HISTORY — PX: ESOPHAGOGASTRODUODENOSCOPY (EGD) WITH PROPOFOL: SHX5813

## 2022-05-25 SURGERY — ESOPHAGOGASTRODUODENOSCOPY (EGD) WITH PROPOFOL
Anesthesia: Monitor Anesthesia Care

## 2022-05-25 MED ORDER — LACTATED RINGERS IV SOLN: INTRAVENOUS | Status: DC

## 2022-05-25 MED ORDER — SODIUM CHLORIDE 0.9 % IV SOLN: INTRAVENOUS | Status: DC

## 2022-05-25 MED ORDER — PROPOFOL 500 MG/50ML IV EMUL
INTRAVENOUS | Status: DC | PRN
  Administered 2022-05-25: 120 ug/kg/min via INTRAVENOUS
  Administered 2022-05-25: 20 mg via INTRAVENOUS

## 2022-05-25 MED ORDER — LIDOCAINE 2% (20 MG/ML) 5 ML SYRINGE
INTRAMUSCULAR | Status: DC | PRN
  Administered 2022-05-25: 30 mg via INTRAVENOUS

## 2022-05-25 SURGICAL SUPPLY — 15 items

## 2022-05-25 NOTE — Discharge Instructions (Signed)
YOU HAD AN ENDOSCOPIC PROCEDURE TODAY: Refer to the procedure report and other information in the discharge instructions given to you for any specific questions about what was found during the examination. If this information does not answer your questions, please call [place] office at [phone] to clarify.  ° °YOU SHOULD EXPECT: Some feelings of bloating in the abdomen. Passage of more gas than usual. Walking can help get rid of the air that was put into your GI tract during the procedure and reduce the bloating. If you had a lower endoscopy (such as a colonoscopy or flexible sigmoidoscopy) you may notice spotting of blood in your stool or on the toilet paper. Some abdominal soreness may be present for a day or two, also. ° °DIET: Your first meal following the procedure should be a light meal and then it is ok to progress to your normal diet. A half-sandwich or bowl of soup is an example of a good first meal. Heavy or fried foods are harder to digest and may make you feel nauseous or bloated. Drink plenty of fluids but you should avoid alcoholic beverages for 24 hours. If you had a esophageal dilation, please see attached instructions for diet.   ° °ACTIVITY: Your care partner should take you home directly after the procedure. You should plan to take it easy, moving slowly for the rest of the day. You can resume normal activity the day after the procedure however YOU SHOULD NOT DRIVE, use power tools, machinery or perform tasks that involve climbing or major physical exertion for 24 hours (because of the sedation medicines used during the test).  ° °SYMPTOMS TO REPORT IMMEDIATELY: °A gastroenterologist can be reached at any hour. Please call [phone]  for any of the following symptoms:  °Following lower endoscopy (colonoscopy, flexible sigmoidoscopy) °Excessive amounts of blood in the stool  °Significant tenderness, worsening of abdominal pains  °Swelling of the abdomen that is new, acute  °Fever of 100° or  higher  °Following upper endoscopy (EGD, EUS, ERCP, esophageal dilation) °Vomiting of blood or coffee ground material  °New, significant abdominal pain  °New, significant chest pain or pain under the shoulder blades  °Painful or persistently difficult swallowing  °New shortness of breath  °Black, tarry-looking or red, bloody stools ° °FOLLOW UP:  °If any biopsies were taken you will be contacted by phone or by letter within the next 1-3 weeks. Call [phone]  if you have not heard about the biopsies in 3 weeks.  °Please also call with any specific questions about appointments or follow up tests. ° °

## 2022-05-25 NOTE — Anesthesia Preprocedure Evaluation (Addendum)
Anesthesia Evaluation  Patient identified by MRN, date of birth, ID band Patient awake    Reviewed: Allergy & Precautions, NPO status , Patient's Chart, lab work & pertinent test results  History of Anesthesia Complications Negative for: history of anesthetic complications  Airway Mallampati: II  TM Distance: >3 FB Neck ROM: Full    Dental  (+) Dental Advisory Given   Pulmonary former smoker   breath sounds clear to auscultation       Cardiovascular hypertension, Pt. on medications and Pt. on home beta blockers (-) angina  Rhythm:Regular Rate:Normal     Neuro/Psych  Headaches  Anxiety Depression    phenteremine   GI/Hepatic Neg liver ROS,GERD  Medicated and Controlled,,  Endo/Other    Morbid obesityPolycystic ovarian  Renal/GU negative Renal ROS     Musculoskeletal   Abdominal   Peds  Hematology negative hematology ROS (+)   Anesthesia Other Findings   Reproductive/Obstetrics Norplant: pt denies any chance pregnant, declines testing                             Anesthesia Physical Anesthesia Plan  ASA: 3  Anesthesia Plan: MAC   Post-op Pain Management: Minimal or no pain anticipated   Induction:   PONV Risk Score and Plan: 2 and Treatment may vary due to age or medical condition and Ondansetron  Airway Management Planned: Natural Airway and Nasal Cannula  Additional Equipment: None  Intra-op Plan:   Post-operative Plan:   Informed Consent: I have reviewed the patients History and Physical, chart, labs and discussed the procedure including the risks, benefits and alternatives for the proposed anesthesia with the patient or authorized representative who has indicated his/her understanding and acceptance.     Dental advisory given  Plan Discussed with: CRNA and Surgeon  Anesthesia Plan Comments:         Anesthesia Quick Evaluation

## 2022-05-25 NOTE — H&P (Signed)
History and Physical:  This patient presents for endoscopic testing for: LUQ pain, N/V   Clinical details in 05/11/22 office consult note with no reported clinical changes since then.  Patient is otherwise without complaints or active issues today.   Past Medical History: Past Medical History:  Diagnosis Date   Anxiety    Benign essential HTN    Depression    Dermoid cyst    per pt, left ovary   Family history of cardiomyopathy 05/30/2018   Migraines    Obesity    Polycystic ovarian disease    Postpartum state    Seasonal allergies    Vaginal Pap smear, abnormal      Past Surgical History: Past Surgical History:  Procedure Laterality Date   ADENOIDECTOMY     CESAREAN SECTION MULTI-GESTATIONAL N/A 01/04/2018   Procedure: CESAREAN SECTION MULTI-GESTATIONAL;  Surgeon: Allyn Kenner, DO;  Location: [facility];  Service: Obstetrics;  Laterality: N/A;   TONSILLECTOMY     UNILATERAL SALPINGECTOMY Left 01/04/2018   Procedure: UNILATERAL SALPINGECTOMY;  Surgeon: Allyn Kenner, DO;  Location: [facility];  Service: Obstetrics;  Laterality: Left;    Allergies: Allergies  Allergen Reactions   Doxycycline Nausea And Vomiting    (patient notes that only the tablets have caused this)    Outpatient Meds: Current Facility-Administered Medications  Medication Dose Route Frequency Provider Last Rate Last Admin   0.9 %  sodium chloride infusion   Intravenous Continuous Danis, Estill Cotta III, MD       lactated ringers infusion   Intravenous Continuous Danis, Estill Cotta III, MD          ___________________________________________________________________ Objective   Exam:  BP 118/82   Pulse 81   Temp (!) 97 F (36.1 C) (Tympanic)   Resp 15   Ht 5\' 8"  (1.727 m)   Wt (!) 147.4 kg   LMP  (LMP Unknown) Comment: more than one year;  SpO2 98%   BMI 49.42 kg/m   CV: regular , S1/S2 Resp: clear to auscultation bilaterally, normal RR and effort noted GI: soft, no  tenderness, with active bowel sounds.   Assessment: LUQ pain Nausea and vomiting   Plan:  EGD   The benefits and risks of the planned procedure were described in detail with the patient or (when appropriate) their health care proxy.  Risks were outlined as including, but not limited to, bleeding, infection, perforation, adverse medication reaction leading to cardiac or pulmonary decompensation, pancreatitis (if ERCP).  The limitation of incomplete mucosal visualization was also discussed.  No guarantees or warranties were given.    The patient is appropriate for an endoscopic procedure in the ambulatory setting.   - Wilfrid Lund, MD

## 2022-05-25 NOTE — Anesthesia Postprocedure Evaluation (Signed)
Anesthesia Post Note  Patient: Miranda Stewart  Procedure(s) Performed: ESOPHAGOGASTRODUODENOSCOPY (EGD) WITH PROPOFOL BIOPSY     Patient location during evaluation: Endoscopy Anesthesia Type: MAC Level of consciousness: awake and alert, patient cooperative and oriented Pain management: pain level controlled Vital Signs Assessment: post-procedure vital signs reviewed and stable Respiratory status: spontaneous breathing, nonlabored ventilation and respiratory function stable Cardiovascular status: stable and blood pressure returned to baseline Postop Assessment: no apparent nausea or vomiting and adequate PO intake Anesthetic complications: no   No notable events documented.  Last Vitals:  Vitals:   05/25/22 0907 05/25/22 1120  BP: 118/82 (!) 161/93  Pulse: 81 (!) 101  Resp: 15 (!) 21  Temp: (!) 36.1 C 36.7 C  SpO2: 98% 98%    Last Pain:  Vitals:   05/25/22 1120  TempSrc: Tympanic  PainSc: 0-No pain                 Nury Nebergall,E. Sobia Karger

## 2022-05-25 NOTE — Anesthesia Procedure Notes (Signed)
Procedure Name: MAC Date/Time: 05/25/2022 10:57 AM  Performed by: Victoriano Lain, CRNAPre-anesthesia Checklist: Patient identified, Emergency Drugs available, Suction available, Patient being monitored and Timeout performed Oxygen Delivery Method: Simple face mask Placement Confirmation: positive ETCO2 Dental Injury: Teeth and Oropharynx as per pre-operative assessment

## 2022-05-25 NOTE — Transfer of Care (Signed)
Immediate Anesthesia Transfer of Care Note  Patient: Miranda Stewart  Procedure(s) Performed: ESOPHAGOGASTRODUODENOSCOPY (EGD) WITH PROPOFOL BIOPSY  Patient Location: PACU and Endoscopy Unit  Anesthesia Type:MAC  Level of Consciousness: awake, alert , oriented, and patient cooperative  Airway & Oxygen Therapy: Patient Spontanous Breathing and Patient connected to face mask oxygen  Post-op Assessment: Report given to RN, Post -op Vital signs reviewed and stable, and Patient moving all extremities  Post vital signs: Reviewed and stable  Last Vitals:  Vitals Value Taken Time  BP 161/93 05/25/22 1120  Temp 36.7 C 05/25/22 1120  Pulse 92 05/25/22 1122  Resp 10 05/25/22 1122  SpO2 100 % 05/25/22 1122  Vitals shown include unvalidated device data.  Last Pain:  Vitals:   05/25/22 1120  TempSrc: Tympanic  PainSc: 0-No pain         Complications: No notable events documented.

## 2022-05-25 NOTE — Op Note (Signed)
[facility] Patient Name: Miranda Stewart Procedure Date: 05/25/2022 MRN: [id number] Attending MD: Estill Cotta. Loletha Carrow , MD, [id number] Date of Birth: 05-Feb-1986 CSN: [id number] Age: 37 Admit Type: Outpatient Procedure:                Upper GI endoscopy Indications:              Abdominal pain in the left upper quadrant, Nausea                            with vomiting                           Clinical details and recent office consult note Providers:                Mallie Mussel L. Loletha Carrow, MD, Fanny Skates RN, RN, William Dalton, Technician Referring MD:              Medicines:                Monitored Anesthesia Care Complications:            No immediate complications. Estimated Blood Loss:     Estimated blood loss was minimal. Procedure:                Pre-Anesthesia Assessment:                           - Prior to the procedure, a History and Physical                            was performed, and patient medications and                            allergies were reviewed. The patient's tolerance of                            previous anesthesia was also reviewed. The risks                            and benefits of the procedure and the sedation                            options and risks were discussed with the patient.                            All questions were answered, and informed consent                            was obtained. Prior Anticoagulants: The patient has                            taken no anticoagulant or antiplatelet agents. ASA  Grade Assessment: IV - A patient with severe                            systemic disease that is a constant threat to life.                            After reviewing the risks and benefits, the patient                            was deemed in satisfactory condition to undergo the                            procedure.                           After obtaining informed consent,  the endoscope was                            passed under direct vision. Throughout the                            procedure, the patient's blood pressure, pulse, and                            oxygen saturations were monitored continuously. The                            [id number] Olympus endoscope was introduced                            through the mouth, and advanced to the second part                            of duodenum. The upper GI endoscopy was performed                            with difficulty due to the patient's agitation. The                            patient tolerated the procedure. Scope In: Scope Out: Findings:      The esophagus was normal.      Normal mucosa was found in the entire examined stomach. Biopsies were       taken with a cold forceps for histology. (Antrum and body in 1 pathology       jar to rule out H. pylori). Stomach was somewhat J-shaped      The examined duodenum was normal. Impression:               - Normal esophagus.                           - Normal mucosa was found in the entire stomach.  Biopsied.                           - Normal examined duodenum. Moderate Sedation:      MAC sedation used Recommendation:           - Patient has a contact number available for                            emergencies. The signs and symptoms of potential                            delayed complications were discussed with the                            patient. Return to normal activities tomorrow.                            Written discharge instructions were provided to the                            patient.                           - Resume previous diet.                           - Continue present medications.                           - Await pathology results. Procedure Code(s):        --- Professional ---                           [id number], Esophagogastroduodenoscopy, flexible,                             transoral; with biopsy, single or multiple Diagnosis Code(s):        --- Professional ---                           R10.12, Left upper quadrant pain                           R11.2, Nausea with vomiting, unspecified CPT copyright 2022 [employer]. All rights reserved. The codes documented in this report are preliminary and upon coder review may  be revised to meet current compliance requirements. Vera Wishart L. Loletha Carrow, MD 05/25/2022 11:13:23 AM This report has been signed electronically. Number of Addenda: 0

## 2022-05-26 LAB — SURGICAL PATHOLOGY

## 2022-05-27 ENCOUNTER — Encounter (HOSPITAL_COMMUNITY): Payer: Self-pay | Admitting: Gastroenterology

## 2022-05-28 ENCOUNTER — Encounter: Payer: Self-pay | Admitting: Gastroenterology

## 2022-06-01 ENCOUNTER — Ambulatory Visit (INDEPENDENT_AMBULATORY_CARE_PROVIDER_SITE_OTHER): Payer: Medicaid Other | Admitting: Internal Medicine

## 2022-06-01 ENCOUNTER — Encounter: Payer: Self-pay | Admitting: Internal Medicine

## 2022-06-01 VITALS — BP 132/82 | HR 84 | Temp 98.2°F | Resp 16 | Ht 68.0 in | Wt 324.0 lb

## 2022-06-01 DIAGNOSIS — E876 Hypokalemia: Secondary | ICD-10-CM | POA: Diagnosis not present

## 2022-06-01 DIAGNOSIS — L03317 Cellulitis of buttock: Secondary | ICD-10-CM | POA: Insufficient documentation

## 2022-06-01 DIAGNOSIS — I10 Essential (primary) hypertension: Secondary | ICD-10-CM | POA: Diagnosis not present

## 2022-06-01 LAB — BASIC METABOLIC PANEL
BUN: 9 mg/dL (ref 6–23)
CO2: 26 mEq/L (ref 19–32)
Calcium: 9.1 mg/dL (ref 8.4–10.5)
Chloride: 107 mEq/L (ref 96–112)
Creatinine, Ser: 0.76 mg/dL (ref 0.40–1.20)
GFR: 100.6 mL/min (ref >60.00)
Glucose, Bld: 105 mg/dL — ABNORMAL HIGH (ref 70–99)
Potassium: 4.2 mEq/L (ref 3.5–5.1)
Sodium: 139 mEq/L (ref 135–145)

## 2022-06-01 LAB — MAGNESIUM: Magnesium: 1.8 mg/dL (ref 1.5–2.5)

## 2022-06-01 MED ORDER — SULFAMETHOXAZOLE-TRIMETHOPRIM 800-160 MG PO TABS
1.0000 | ORAL_TABLET | Freq: Two times a day (BID) | ORAL | 0 refills | Status: AC
Start: 2022-06-01 — End: 2022-06-11

## 2022-06-01 NOTE — Progress Notes (Unsigned)
Subjective:  Patient ID: Miranda Stewart, female    DOB: 25-Apr-1985  Age: 37 y.o. MRN: [id number]  CC: Hypertension   HPI Miranda Stewart presents for f/up ----  She complains of a 4 day history of swelling and pain over her upper right buttocks.  Outpatient Medications Prior to Visit  Medication Sig Dispense Refill   Cholecalciferol (VITAMIN D3) 1.25 MG (50000 UT) CAPS TAKE 1 CAPSULE BY MOUTH ONE TIME PER WEEK (Patient taking differently: Take 50,000 Units by mouth every Friday.) 12 capsule 0   etonogestrel (NEXPLANON) 68 MG IMPL implant 1 each (68 mg total) by Subdermal route. 1 each 0   labetalol (NORMODYNE) 100 MG tablet Take 1 tablet (100 mg total) by mouth 2 (two) times daily. 180 tablet 1   lubiprostone (AMITIZA) 24 MCG capsule Take 1 capsule (24 mcg total) by mouth 2 (two) times daily with a meal. 180 capsule 1   ondansetron (ZOFRAN) 4 MG tablet Take 1 tablet (4 mg total) by mouth every 6 (six) hours as needed for nausea or vomiting. 15 tablet 0   pantoprazole (PROTONIX) 40 MG tablet Take 1 tablet (40 mg total) by mouth daily. 30 tablet 1   potassium chloride (KLOR-CON 10) 10 MEQ tablet Take 1 tablet (10 mEq total) by mouth 2 (two) times daily. 180 tablet 0   sucralfate (CARAFATE) 1 g tablet Take 1 tablet (1 g total) by mouth 3 (three) times daily as needed (with meals for stomach pain). 60 tablet 0   phentermine (ADIPEX-P) 37.5 MG tablet TAKE 1 TABLET BY MOUTH DAILY BEFORE BREAKFAST 90 tablet 0   No facility-administered medications prior to visit.    ROS Review of Systems  Constitutional: Negative.  Negative for chills, fatigue and fever.  HENT: Negative.    Eyes: Negative.   Respiratory:  Negative for cough, chest tightness and wheezing.   Cardiovascular:  Negative for chest pain, palpitations and leg swelling.  Gastrointestinal:  Negative for abdominal pain.  Endocrine: Negative.   Genitourinary: Negative.  Negative for difficulty urinating.  Musculoskeletal:  Negative.  Negative for neck stiffness.  Skin: Negative.  Negative for color change and pallor.  Neurological:  Negative for dizziness and weakness.  Hematological:  Negative for adenopathy. Does not bruise/bleed easily.  Psychiatric/Behavioral: Negative.      Objective:  BP 132/82 (BP Location: Right Arm, Patient Position: Sitting, Cuff Size: Large)   Pulse 84   Temp 98.2 F (36.8 C) (Oral)   Resp 16   Ht 5\' 8"  (1.727 m)   Wt (!) 324 lb (147 kg)   LMP  (LMP Unknown) Comment: more than one year;  SpO2 96%   BMI 49.26 kg/m   BP Readings from Last 3 Encounters:  06/01/22 132/82  05/25/22 (!) 160/90  05/11/22 126/70    Wt Readings from Last 3 Encounters:  06/01/22 (!) 324 lb (147 kg)  05/25/22 (!) 325 lb (147.4 kg)  05/11/22 (!) 325 lb 6 oz (147.6 kg)    Physical Exam Vitals reviewed.  HENT:     Nose: Nose normal.     Mouth/Throat:     Mouth: Mucous membranes are moist.  Eyes:     General: No scleral icterus.    Conjunctiva/sclera: Conjunctivae normal.  Cardiovascular:     Rate and Rhythm: Normal rate and regular rhythm.     Heart sounds: No murmur heard. Pulmonary:     Effort: Pulmonary effort is normal.     Breath sounds: No stridor. No wheezing,  rhonchi or rales.  Abdominal:     General: Abdomen is protuberant. Bowel sounds are normal. There is no distension.     Palpations: Abdomen is soft. There is no hepatomegaly, splenomegaly or mass.  Musculoskeletal:        General: Normal range of motion.     Cervical back: Neck supple.     Lumbar back: Normal.       Back:     Right lower leg: No edema.     Left lower leg: No edema.  Lymphadenopathy:     Cervical: No cervical adenopathy.  Skin:    General: Skin is warm and dry.  Neurological:     General: No focal deficit present.     Mental Status: She is alert.     Lab Results  Component Value Date   WBC 8.3 03/18/2022   HGB 14.0 03/18/2022   HCT 40.7 03/18/2022   PLT 289.0 03/18/2022   GLUCOSE 105  (H) 06/01/2022   CHOL 163 06/25/2021   TRIG 80.0 06/25/2021   HDL 32.20 (L) 06/25/2021   LDLCALC 115 (H) 06/25/2021   ALT 30 03/14/2022   AST 26 03/14/2022   NA 139 06/01/2022   K 4.2 06/01/2022   CL 107 06/01/2022   CREATININE 0.76 06/01/2022   BUN 9 06/01/2022   CO2 26 06/01/2022   TSH 1.65 06/25/2021   HGBA1C 6.1 03/18/2022    No results found.  Assessment & Plan:  Hypokalemia -     Basic metabolic panel; Future -     Magnesium; Future  Morbid obesity -     Phentermine HCl; Take 1 tablet (37.5 mg total) by mouth daily before breakfast.  Dispense: 90 tablet; Refill: 0  Primary hypertension- Her BP is well controlled. -     Basic metabolic panel; Future -     Magnesium; Future  Cellulitis of right buttock -     Sulfamethoxazole-Trimethoprim; Take 1 tablet by mouth 2 (two) times daily for 10 days.  Dispense: 20 tablet; Refill: 0     Follow-up: Return in about 2 weeks (around 06/15/2022).  Miranda Linger, MD

## 2022-06-01 NOTE — Patient Instructions (Signed)

## 2022-06-02 MED ORDER — PHENTERMINE HCL 37.5 MG PO TABS: 37.5000 mg | ORAL_TABLET | Freq: Every day | ORAL | 0 refills | Status: DC

## 2022-08-20 ENCOUNTER — Other Ambulatory Visit: Payer: Self-pay | Admitting: Internal Medicine

## 2022-08-20 DIAGNOSIS — E559 Vitamin D deficiency, unspecified: Secondary | ICD-10-CM

## 2022-08-27 ENCOUNTER — Other Ambulatory Visit: Payer: Self-pay | Admitting: Internal Medicine

## 2022-08-27 DIAGNOSIS — I10 Essential (primary) hypertension: Secondary | ICD-10-CM

## 2022-08-27 DIAGNOSIS — E876 Hypokalemia: Secondary | ICD-10-CM

## 2023-05-09 ENCOUNTER — Emergency Department (HOSPITAL_COMMUNITY)
Admission: EM | Admit: 2023-05-09 | Discharge: 2023-05-09 | Disposition: A | Discharge status: Home / Self Care | Attending: Emergency Medicine | Admitting: Emergency Medicine

## 2023-05-09 ENCOUNTER — Encounter: Payer: Self-pay | Admitting: Internal Medicine

## 2023-05-09 ENCOUNTER — Other Ambulatory Visit: Payer: Self-pay

## 2023-05-09 DIAGNOSIS — I10 Essential (primary) hypertension: Secondary | ICD-10-CM | POA: Diagnosis not present

## 2023-05-09 DIAGNOSIS — B3731 Acute candidiasis of vulva and vagina: Secondary | ICD-10-CM | POA: Insufficient documentation

## 2023-05-09 DIAGNOSIS — Z79899 Other long term (current) drug therapy: Secondary | ICD-10-CM | POA: Insufficient documentation

## 2023-05-09 DIAGNOSIS — R102 Pelvic and perineal pain: Secondary | ICD-10-CM | POA: Diagnosis present

## 2023-05-09 LAB — CBC WITH DIFFERENTIAL/PLATELET
Abs Immature Granulocytes: 0.02 10*3/uL (ref 0.00–0.07)
Basophils Absolute: 0 10*3/uL (ref 0.0–0.1)
Basophils Relative: 0 %
Eosinophils Absolute: 0 10*3/uL (ref 0.0–0.5)
Eosinophils Relative: 0 %
HCT: 45 % (ref 36.0–46.0)
Hemoglobin: 15.7 g/dL — ABNORMAL HIGH (ref 12.0–15.0)
Immature Granulocytes: 0 %
Lymphocytes Relative: 24 %
Lymphs Abs: 1.8 10*3/uL (ref 0.7–4.0)
MCH: 26.9 pg (ref 26.0–34.0)
MCHC: 34.9 g/dL (ref 30.0–36.0)
MCV: 77.2 fL — ABNORMAL LOW (ref 80.0–100.0)
Monocytes Absolute: 0.8 10*3/uL (ref 0.1–1.0)
Monocytes Relative: 10 %
Neutro Abs: 4.9 10*3/uL (ref 1.7–7.7)
Neutrophils Relative %: 66 %
Platelets: 254 10*3/uL (ref 150–400)
RBC: 5.83 MIL/uL — ABNORMAL HIGH (ref 3.87–5.11)
RDW: 12.1 % (ref 11.5–15.5)
WBC: 7.5 10*3/uL (ref 4.0–10.5)
nRBC: 0 % (ref 0.0–0.2)

## 2023-05-09 LAB — URINALYSIS, ROUTINE W REFLEX MICROSCOPIC
Bilirubin Urine: NEGATIVE
Glucose, UA: >=500 mg/dL — AB
Ketones, ur: 20 mg/dL — AB
Leukocytes,Ua: NEGATIVE
Nitrite: NEGATIVE
Protein, ur: NEGATIVE mg/dL
Specific Gravity, Urine: 1.033 — ABNORMAL HIGH (ref 1.005–1.030)
pH: 6 (ref 5.0–8.0)

## 2023-05-09 LAB — WET PREP, GENITAL
Clue Cells Wet Prep HPF POC: NONE SEEN
Sperm: NONE SEEN
Trich, Wet Prep: NONE SEEN
WBC, Wet Prep HPF POC: >=10 — AB (ref <10)
Yeast Wet Prep HPF POC: NONE SEEN

## 2023-05-09 LAB — HCG, SERUM, QUALITATIVE: Preg, Serum: NEGATIVE

## 2023-05-09 MED ORDER — ACETAMINOPHEN 500 MG PO TABS
1000.0000 mg | ORAL_TABLET | Freq: Once | ORAL | Status: AC
  Administered 2023-05-09: 1000 mg via ORAL
  Filled 2023-05-09: qty 2

## 2023-05-09 MED ORDER — FLUCONAZOLE 150 MG PO TABS: 150.0000 mg | ORAL_TABLET | Freq: Every day | ORAL | 0 refills | Status: DC

## 2023-05-09 MED ORDER — NYSTATIN 100000 UNIT/GM EX CREA: 1.0000 | TOPICAL_CREAM | Freq: Four times a day (QID) | CUTANEOUS | 0 refills | Status: DC

## 2023-05-09 MED ORDER — ONDANSETRON 4 MG PO TBDP
4.0000 mg | ORAL_TABLET | Freq: Once | ORAL | Status: AC
  Administered 2023-05-09: 4 mg via ORAL
  Filled 2023-05-09: qty 1

## 2023-05-09 NOTE — Discharge Instructions (Addendum)
 You have symptoms likely due to vaginal yeast infection.  Please use nystatin cream and apply to affected area 4 times daily for the next 10 days.  Take a one-time dose of Diflucan.  Follow-up closely with your doctor for further care.

## 2023-05-09 NOTE — ED Triage Notes (Signed)
 Pt. Stated, I've had some vaginal pain with sex, vaginal discharge, vaginal itching, Ive been tired all the time for 3 weeks. Ive had some nauseous for a week and not eating good. I stopped with the diarrhea.

## 2023-05-09 NOTE — ED Notes (Signed)
 Pt verbalized understanding of discharge instructions. Patient does not have any concerns at this time, patient ambulated to ED lobby.

## 2023-05-09 NOTE — ED Provider Notes (Signed)
 [place] EMERGENCY DEPARTMENT AT [facility] Provider Note   CSN: [id number] Arrival date & time: 05/09/23  3474     History  Chief Complaint  Patient presents with   Diarrhea   Vaginal Discharge   Vaginal Pain   Nausea   Fatigue    Miranda Stewart is a 38 y.o. female.  The history is provided by the patient and medical records. No language interpreter was used.  Diarrhea Vaginal Discharge Vaginal Pain    Pt is a 38 yo female with a PMH of HTN, prediabetes, and morbid obesity presenting to the ER today with complaints of vaginal itching, burning, dyspareunia and urinary frequency. Pt states that her sxs first began about two weeks ago. She states that she noticed increased vaginal itching as well as discharge. She believed that it was a yeast infection and used OTC miconazole but states this did not relieve her sxs. She states that after the onset of the vaginal itching, she had sexual intercourse with her partner which was painful. Since then she has been experiencing increased vaginal discharge, odor and pain. She describes her vaginal discharge as thin, clear and odorous. Pt states that she only has one sexual partner, of the opposite sex. They do not use protection but she does have nexplanon for birth control. Pt also endorses urinary frequency and urinary incontinence which started when her onset of sxs. She denies fever, abdominal pain, cramping, bloating, nausea but does endorse hot flashes and nausea.   Home Medications Prior to Admission medications   Medication Sig Start Date End Date Taking? Authorizing Provider  Cholecalciferol (VITAMIN D3) 1.25 MG (50000 UT) CAPS TAKE 1 CAPSULE BY MOUTH ONE TIME PER WEEK 08/20/22   [name], MD  etonogestrel (NEXPLANON) 68 MG IMPL implant 1 each (68 mg total) by Subdermal route. 07/10/21   [name], MD  labetalol (NORMODYNE) 100 MG tablet Take 1 tablet (100 mg total) by mouth 2 (two) times daily. 09/24/21    [name], MD  lubiprostone (AMITIZA) 24 MCG capsule Take 1 capsule (24 mcg total) by mouth 2 (two) times daily with a meal. 09/24/21   [name], MD  ondansetron (ZOFRAN) 4 MG tablet Take 1 tablet (4 mg total) by mouth every 6 (six) hours as needed for nausea or vomiting. 03/14/22   Prosperi, Christian H, PA-C  pantoprazole (PROTONIX) 40 MG tablet Take 1 tablet (40 mg total) by mouth daily. 03/14/22   Prosperi, Christian H, PA-C  phentermine (ADIPEX-P) 37.5 MG tablet Take 1 tablet (37.5 mg total) by mouth daily before breakfast. 06/02/22   [name], MD  potassium chloride (KLOR-CON) 10 MEQ tablet TAKE 1 TABLET BY MOUTH 2 TIMES DAILY. 08/27/22   [name], MD  sucralfate (CARAFATE) 1 g tablet Take 1 tablet (1 g total) by mouth 3 (three) times daily as needed (with meals for stomach pain). 03/14/22   Prosperi, Christian H, PA-C      Allergies    Doxycycline    Review of Systems   Review of Systems  Gastrointestinal:  Positive for diarrhea.  Genitourinary:  Positive for vaginal discharge and vaginal pain.  All other systems reviewed and are negative.   Physical Exam Updated Vital Signs BP (!) 145/104 (BP Location: Right Arm)   Pulse (!) 116   Temp 98 F (36.7 C)   Resp 17   Ht 5\' 8"  (1.727 m)   Wt (!) 148.8 kg   SpO2 96%  BMI 49.87 kg/m  Physical Exam Vitals and nursing note reviewed.  Constitutional:      General: She is not in acute distress.    Appearance: She is well-developed.  HENT:     Head: Atraumatic.  Eyes:     Conjunctiva/sclera: Conjunctivae normal.  Pulmonary:     Effort: Pulmonary effort is normal.  Abdominal:     Palpations: Abdomen is soft.     Tenderness: There is no abdominal tenderness.  Musculoskeletal:     Cervical back: Neck supple.  Skin:    Findings: No rash.  Neurological:     Mental Status: She is alert.  Psychiatric:        Mood and Affect: Mood normal.    ED Results / Procedures / Treatments   Labs (all labs ordered  are listed, but only abnormal results are displayed) Labs Reviewed  WET PREP, GENITAL - Abnormal; Notable for the following components:      Result Value   WBC, Wet Prep HPF POC >=10 (*)    All other components within normal limits  URINALYSIS, ROUTINE W REFLEX MICROSCOPIC - Abnormal; Notable for the following components:   Color, Urine STRAW (*)    Specific Gravity, Urine 1.033 (*)    Glucose, UA >=500 (*)    Hgb urine dipstick LARGE (*)    Ketones, ur 20 (*)    Bacteria, UA RARE (*)    All other components within normal limits  CBC WITH DIFFERENTIAL/PLATELET - Abnormal; Notable for the following components:   RBC 5.83 (*)    Hemoglobin 15.7 (*)    MCV 77.2 (*)    All other components within normal limits  HCG, SERUM, QUALITATIVE  GC/CHLAMYDIA PROBE AMP ([place]) NOT AT [facility]    EKG None  Radiology No results found.  Procedures .Pelvic exam  Date/Time: 05/09/2023 11:18 AM  Performed by: Fayrene Helper, PA-C Authorized by: Fayrene Helper, PA-C  Comments: Chaperone present during exam.  No inguinal lymphadenopathy or inguinal hernia noted.  Dry cirrhotic skin changes around the perivaginal region.  Some odor noted.  No significant discomfort with speculum insertion.  Closed cervical os with some mild friable tissue.  No significant vaginal discharge noted no vaginal tear.  On bimanual exam no adnexal tenderness without cervical motion tenderness.      Medications Ordered in ED Medications  acetaminophen (TYLENOL) tablet 1,000 mg (1,000 mg Oral Given 05/09/23 0908)  ondansetron (ZOFRAN-ODT) disintegrating tablet 4 mg (4 mg Oral Given 05/09/23 2952)    ED Course/ Medical Decision Making/ A&P                                 Medical Decision Making Amount and/or Complexity of Data Reviewed Labs: ordered.  Risk OTC drugs. Prescription drug management.   BP (!) 145/104 (BP Location: Right Arm)   Pulse (!) 116   Temp 98 F (36.7 C)   Resp 17   Ht 5\' 8"  (1.727 m)    Wt (!) 148.8 kg   SpO2 96%   BMI 49.87 kg/m   26:7 AM  38 year old female significant history of PCOS, obesity, anxiety, prediabetes presenting with complaints of vaginal discomfort.  Patient states for the past 2 weeks she has noticed some vaginal irritation and described more as an itchy and burning sensation to the skin around her vagina.  She also endorses increased urinary frequency and urgency.  She admits to sexual activities with  her partner not using protection because she has a Nexplanon.  She is unable to recall her last menstrual period.  She endorsed some mild nausea and occasional diarrhea but that has since resolved.  She has noticed some vaginal odor.  She denies any back pain.  She denies any change in her feminine product except started to use vagisil cream when she developed the symptoms.  Exam notable for obese patient resting comfortably appears to be in no acute discomfort.  Abdomen is soft nontender.  Pelvic exam showing dry and xerotic skin changes to her perivaginal region with some odor no evidence suggestive of PID.  -Labs ordered, independently viewed and interpreted by me.  Labs remarkable for wet prep shows evidence of WBC but no clue cells.  UA shows budding yeast and hyphae yeast with large hemoglobin and urine dipsticks but no obvious signs of UTI.  Pregnancy test negative.  GC chlamydia test currently pending. -Imaging including transvaginal US considered but low suspicion for ovarian torsion or ectopic pregnancy -This patient presents to the ED for concern of vaginal discomfort, this involves an extensive number of treatment options, and is a complaint that carries with it a high risk of complications and morbidity.  The differential diagnosis includes yeast, bv, cervicitis, PID, ovarian torsion, TOA, vaginal tear, uti -Co morbidities that complicate the patient evaluation includes obesity, pcos -Treatment includes reassurance -Reevaluation of the patient after  these medicines showed that the patient stayed the same -PCP office notes or outside notes reviewed -Escalation to admission/observation considered: patients feels much better, is comfortable with discharge, and will follow up with PCP -Prescription medication considered, patient comfortable with diflucan -Social Determinant of Health considered which includes food insecurity, financial hardship, depression         Final Clinical Impression(s) / ED Diagnoses Final diagnoses:  Vagina, candidiasis    Rx / DC Orders ED Discharge Orders          Ordered    fluconazole (DIFLUCAN) 150 MG tablet  Daily        05/09/23 1330    nystatin cream (MYCOSTATIN)  4 times daily        05/09/23 1330              Fayrene Helper, PA-C 05/09/23 1331    Theresia Lo, Homer K, DO 05/09/23 1526

## 2023-05-10 LAB — GC/CHLAMYDIA PROBE AMP (~~LOC~~) NOT AT ARMC
Chlamydia: NEGATIVE
Comment: NEGATIVE
Comment: NORMAL
Neisseria Gonorrhea: NEGATIVE

## 2023-05-12 ENCOUNTER — Encounter (HOSPITAL_COMMUNITY): Payer: Self-pay

## 2023-05-12 ENCOUNTER — Other Ambulatory Visit: Payer: Self-pay

## 2023-05-12 ENCOUNTER — Inpatient Hospital Stay (HOSPITAL_COMMUNITY)
Admission: EM | Admit: 2023-05-12 | Discharge: 2023-05-14 | DRG: 638 | Disposition: A | Discharge status: Home / Self Care | Attending: Internal Medicine | Admitting: Internal Medicine

## 2023-05-12 DIAGNOSIS — Z6841 Body Mass Index (BMI) 40.0 and over, adult: Secondary | ICD-10-CM

## 2023-05-12 DIAGNOSIS — B3731 Acute candidiasis of vulva and vagina: Secondary | ICD-10-CM | POA: Diagnosis present

## 2023-05-12 DIAGNOSIS — Z8249 Family history of ischemic heart disease and other diseases of the circulatory system: Secondary | ICD-10-CM | POA: Diagnosis not present

## 2023-05-12 DIAGNOSIS — Z87891 Personal history of nicotine dependence: Secondary | ICD-10-CM | POA: Diagnosis not present

## 2023-05-12 DIAGNOSIS — I10 Essential (primary) hypertension: Secondary | ICD-10-CM | POA: Diagnosis present

## 2023-05-12 DIAGNOSIS — E876 Hypokalemia: Secondary | ICD-10-CM | POA: Diagnosis present

## 2023-05-12 DIAGNOSIS — E0810 Diabetes mellitus due to underlying condition with ketoacidosis without coma: Secondary | ICD-10-CM

## 2023-05-12 DIAGNOSIS — E282 Polycystic ovarian syndrome: Secondary | ICD-10-CM | POA: Diagnosis present

## 2023-05-12 DIAGNOSIS — E662 Morbid (severe) obesity with alveolar hypoventilation: Secondary | ICD-10-CM | POA: Diagnosis present

## 2023-05-12 DIAGNOSIS — F419 Anxiety disorder, unspecified: Secondary | ICD-10-CM | POA: Diagnosis present

## 2023-05-12 DIAGNOSIS — E1110 Type 2 diabetes mellitus with ketoacidosis without coma: Secondary | ICD-10-CM | POA: Diagnosis present

## 2023-05-12 DIAGNOSIS — N179 Acute kidney failure, unspecified: Secondary | ICD-10-CM | POA: Diagnosis present

## 2023-05-12 DIAGNOSIS — Z833 Family history of diabetes mellitus: Secondary | ICD-10-CM | POA: Diagnosis not present

## 2023-05-12 DIAGNOSIS — Z881 Allergy status to other antibiotic agents status: Secondary | ICD-10-CM

## 2023-05-12 LAB — BASIC METABOLIC PANEL
Anion gap: 22 — ABNORMAL HIGH (ref 5–15)
Anion gap: 21 — ABNORMAL HIGH (ref 5–15)
Anion gap: 15 (ref 5–15)
BUN: 16 mg/dL (ref 6–20)
BUN: 16 mg/dL (ref 6–20)
BUN: 12 mg/dL (ref 6–20)
CO2: 16 mmol/L — ABNORMAL LOW (ref 22–32)
CO2: 13 mmol/L — ABNORMAL LOW (ref 22–32)
CO2: 14 mmol/L — ABNORMAL LOW (ref 22–32)
Calcium: 9.5 mg/dL (ref 8.9–10.3)
Calcium: 9.3 mg/dL (ref 8.9–10.3)
Calcium: 8.4 mg/dL — ABNORMAL LOW (ref 8.9–10.3)
Chloride: 89 mmol/L — ABNORMAL LOW (ref 98–111)
Chloride: 93 mmol/L — ABNORMAL LOW (ref 98–111)
Chloride: 104 mmol/L (ref 98–111)
Creatinine, Ser: 1.47 mg/dL — ABNORMAL HIGH (ref 0.44–1.00)
Creatinine, Ser: 1.41 mg/dL — ABNORMAL HIGH (ref 0.44–1.00)
Creatinine, Ser: 1.07 mg/dL — ABNORMAL HIGH (ref 0.44–1.00)
GFR, Estimated: 47 mL/min — ABNORMAL LOW (ref >60)
GFR, Estimated: 49 mL/min — ABNORMAL LOW (ref >60)
GFR, Estimated: >60 mL/min (ref >60)
Glucose, Bld: 654 mg/dL (ref 70–99)
Glucose, Bld: 590 mg/dL (ref 70–99)
Glucose, Bld: 334 mg/dL — ABNORMAL HIGH (ref 70–99)
Potassium: 4.8 mmol/L (ref 3.5–5.1)
Potassium: 4.8 mmol/L (ref 3.5–5.1)
Potassium: 4.1 mmol/L (ref 3.5–5.1)
Sodium: 127 mmol/L — ABNORMAL LOW (ref 135–145)
Sodium: 127 mmol/L — ABNORMAL LOW (ref 135–145)
Sodium: 133 mmol/L — ABNORMAL LOW (ref 135–145)

## 2023-05-12 LAB — I-STAT VENOUS BLOOD GAS, ED
Acid-base deficit: 9 mmol/L — ABNORMAL HIGH (ref 0.0–2.0)
Bicarbonate: 16.1 mmol/L — ABNORMAL LOW (ref 20.0–28.0)
Calcium, Ion: 1.13 mmol/L — ABNORMAL LOW (ref 1.15–1.40)
HCT: 50 % — ABNORMAL HIGH (ref 36.0–46.0)
Hemoglobin: 17 g/dL — ABNORMAL HIGH (ref 12.0–15.0)
O2 Saturation: 48 %
Potassium: 4.7 mmol/L (ref 3.5–5.1)
Sodium: 128 mmol/L — ABNORMAL LOW (ref 135–145)
TCO2: 17 mmol/L — ABNORMAL LOW (ref 22–32)
pCO2, Ven: 33.5 mmHg — ABNORMAL LOW (ref 44–60)
pH, Ven: 7.289 (ref 7.25–7.43)
pO2, Ven: 29 mmHg — CL (ref 32–45)

## 2023-05-12 LAB — CBC WITH DIFFERENTIAL/PLATELET
Abs Immature Granulocytes: 0.06 10*3/uL (ref 0.00–0.07)
Basophils Absolute: 0 10*3/uL (ref 0.0–0.1)
Basophils Relative: 0 %
Eosinophils Absolute: 0 10*3/uL (ref 0.0–0.5)
Eosinophils Relative: 0 %
HCT: 46.9 % — ABNORMAL HIGH (ref 36.0–46.0)
Hemoglobin: 16.2 g/dL — ABNORMAL HIGH (ref 12.0–15.0)
Immature Granulocytes: 1 %
Lymphocytes Relative: 25 %
Lymphs Abs: 2.9 10*3/uL (ref 0.7–4.0)
MCH: 27.3 pg (ref 26.0–34.0)
MCHC: 34.5 g/dL (ref 30.0–36.0)
MCV: 79 fL — ABNORMAL LOW (ref 80.0–100.0)
Monocytes Absolute: 1 10*3/uL (ref 0.1–1.0)
Monocytes Relative: 9 %
Neutro Abs: 7.5 10*3/uL (ref 1.7–7.7)
Neutrophils Relative %: 65 %
Platelets: 258 10*3/uL (ref 150–400)
RBC: 5.94 MIL/uL — ABNORMAL HIGH (ref 3.87–5.11)
RDW: 12.3 % (ref 11.5–15.5)
WBC: 11.5 10*3/uL — ABNORMAL HIGH (ref 4.0–10.5)
nRBC: 0 % (ref 0.0–0.2)

## 2023-05-12 LAB — CBG MONITORING, ED
Glucose-Capillary: 570 mg/dL (ref 70–99)
Glucose-Capillary: 496 mg/dL — ABNORMAL HIGH (ref 70–99)
Glucose-Capillary: >600 mg/dL (ref 70–99)
Glucose-Capillary: 459 mg/dL — ABNORMAL HIGH (ref 70–99)
Glucose-Capillary: 408 mg/dL — ABNORMAL HIGH (ref 70–99)
Glucose-Capillary: 414 mg/dL — ABNORMAL HIGH (ref 70–99)
Glucose-Capillary: 319 mg/dL — ABNORMAL HIGH (ref 70–99)
Glucose-Capillary: 393 mg/dL — ABNORMAL HIGH (ref 70–99)
Glucose-Capillary: 297 mg/dL — ABNORMAL HIGH (ref 70–99)
Glucose-Capillary: 299 mg/dL — ABNORMAL HIGH (ref 70–99)

## 2023-05-12 LAB — URINALYSIS, ROUTINE W REFLEX MICROSCOPIC
Bacteria, UA: NONE SEEN
Bilirubin Urine: NEGATIVE
Glucose, UA: >=500 mg/dL — AB
Ketones, ur: 80 mg/dL — AB
Leukocytes,Ua: NEGATIVE
Nitrite: NEGATIVE
Protein, ur: NEGATIVE mg/dL
Specific Gravity, Urine: 1.03 (ref 1.005–1.030)
pH: 5 (ref 5.0–8.0)

## 2023-05-12 LAB — HCG, SERUM, QUALITATIVE: Preg, Serum: NEGATIVE

## 2023-05-12 LAB — BETA-HYDROXYBUTYRIC ACID: Beta-Hydroxybutyric Acid: 7.59 mmol/L — ABNORMAL HIGH (ref 0.05–0.27)

## 2023-05-12 MED ORDER — INSULIN REGULAR(HUMAN) IN NACL 100-0.9 UT/100ML-% IV SOLN
INTRAVENOUS | Status: DC
  Administered 2023-05-12: 9.5 [IU]/h via INTRAVENOUS
  Administered 2023-05-13: 6 [IU]/h via INTRAVENOUS
  Filled 2023-05-12 (×2): qty 100

## 2023-05-12 MED ORDER — ACETAMINOPHEN 650 MG RE SUPP: 650.0000 mg | Freq: Four times a day (QID) | RECTAL | Status: DC | PRN

## 2023-05-12 MED ORDER — LACTATED RINGERS IV SOLN: INTRAVENOUS | Status: DC

## 2023-05-12 MED ORDER — ACETAMINOPHEN 325 MG PO TABS: 650.0000 mg | ORAL_TABLET | Freq: Four times a day (QID) | ORAL | Status: DC | PRN

## 2023-05-12 MED ORDER — METOPROLOL SUCCINATE ER 25 MG PO TB24
25.0000 mg | ORAL_TABLET | Freq: Every day | ORAL | Status: DC
  Administered 2023-05-12: 25 mg via ORAL
  Filled 2023-05-12: qty 1

## 2023-05-12 MED ORDER — POTASSIUM CHLORIDE 10 MEQ/100ML IV SOLN
10.0000 meq | INTRAVENOUS | Status: AC
  Administered 2023-05-12 (×2): 10 meq via INTRAVENOUS
  Filled 2023-05-12 (×2): qty 100

## 2023-05-12 MED ORDER — ONDANSETRON HCL 4 MG/2ML IJ SOLN
4.0000 mg | Freq: Once | INTRAMUSCULAR | Status: AC
  Administered 2023-05-12: 4 mg via INTRAVENOUS
  Filled 2023-05-12: qty 2

## 2023-05-12 MED ORDER — METOPROLOL TARTRATE 5 MG/5ML IV SOLN: 5.0000 mg | INTRAVENOUS | Status: DC | PRN

## 2023-05-12 MED ORDER — LACTATED RINGERS IV BOLUS
20.0000 mL/kg | Freq: Once | INTRAVENOUS | Status: AC
  Administered 2023-05-12: 2976 mL via INTRAVENOUS

## 2023-05-12 MED ORDER — DEXTROSE IN LACTATED RINGERS 5 % IV SOLN: INTRAVENOUS | Status: DC

## 2023-05-12 MED ORDER — ONDANSETRON HCL 4 MG PO TABS
4.0000 mg | ORAL_TABLET | Freq: Four times a day (QID) | ORAL | Status: DC | PRN
  Administered 2023-05-13: 4 mg via ORAL
  Filled 2023-05-12 (×2): qty 1

## 2023-05-12 MED ORDER — DEXTROSE 50 % IV SOLN: 0.0000 mL | INTRAVENOUS | Status: DC | PRN

## 2023-05-12 MED ORDER — ONDANSETRON HCL 4 MG/2ML IJ SOLN
4.0000 mg | Freq: Four times a day (QID) | INTRAMUSCULAR | Status: DC | PRN
  Administered 2023-05-13: 4 mg via INTRAVENOUS
  Filled 2023-05-12: qty 2

## 2023-05-12 MED ORDER — MELATONIN 5 MG PO TABS: 5.0000 mg | ORAL_TABLET | Freq: Every evening | ORAL | Status: DC | PRN

## 2023-05-12 MED ORDER — ENOXAPARIN SODIUM 40 MG/0.4ML IJ SOSY: 40.0000 mg | PREFILLED_SYRINGE | INTRAMUSCULAR | Status: DC

## 2023-05-12 MED ORDER — PANTOPRAZOLE SODIUM 40 MG IV SOLR
40.0000 mg | INTRAVENOUS | Status: DC
  Administered 2023-05-12 – 2023-05-13 (×2): 40 mg via INTRAVENOUS
  Filled 2023-05-12 (×2): qty 10

## 2023-05-12 MED ORDER — SODIUM CHLORIDE 0.9 % IV BOLUS
1000.0000 mL | Freq: Once | INTRAVENOUS | Status: AC
  Administered 2023-05-12: 1000 mL via INTRAVENOUS

## 2023-05-12 MED ORDER — SENNOSIDES-DOCUSATE SODIUM 8.6-50 MG PO TABS: 1.0000 | ORAL_TABLET | Freq: Every evening | ORAL | Status: DC | PRN

## 2023-05-12 MED ORDER — HEPARIN SODIUM (PORCINE) 5000 UNIT/ML IJ SOLN
5000.0000 [IU] | Freq: Three times a day (TID) | INTRAMUSCULAR | Status: DC
  Administered 2023-05-12 – 2023-05-14 (×5): 5000 [IU] via SUBCUTANEOUS
  Filled 2023-05-12 (×5): qty 1

## 2023-05-12 NOTE — ED Provider Triage Note (Signed)
 Emergency Medicine Provider Triage Evaluation Note  MASSA PE , a 38 y.o. female  was evaluated in triage.  Pt complains of weakness. Endorse polyuria, polydipsia, fatigue, blurry vision, excessive sleepiness x 2 weeks.  Pt has hx of prediabetes.  Was seen recently for vaginal rash related to yeast infection.    Review of Systems  Positive: As above Negative: As above  Physical Exam  BP (!) 151/89 (BP Location: Right Arm)   Pulse (!) 129   Temp 98.1 F (36.7 C)   Resp 20   Ht 5\' 8"  (1.727 m)   Wt (!) 148.8 kg   LMP  (Approximate)   SpO2 98%   BMI 49.87 kg/m  Gen:   Awake, no distress   Resp:  Normal effort  MSK:   Moves extremities without difficulty  Other:    Medical Decision Making  Medically screening exam initiated at 4:20 PM.  Appropriate orders placed.  Terence Lux was informed that the remainder of the evaluation will be completed by another provider, this initial triage assessment does not replace that evaluation, and the importance of remaining in the ED until their evaluation is complete.  CBG 570 consistent with diabetes.  Will check for DKA. Request next available room   Fayrene Helper, Cordelia Poche 05/12/23 1624

## 2023-05-12 NOTE — ED Notes (Signed)
 Istat flagggas gave results to bowie t.pa by Crisanto Nied thomased

## 2023-05-12 NOTE — ED Triage Notes (Addendum)
 Pt reports weakness x 2 weeks, states she has been sleeping a lot and when she is exerting energy she feels palpitations. She also reports she has been wearing a diaper because she can't control her bladder, as well as blurry vision. She also reports increased thirst, dry mouth, nausea and vomiting. She reports she has not been diagnosed with Diabetes.

## 2023-05-12 NOTE — H&P (Incomplete)
 PCP:   [name], MD   Chief Complaint:  Nausea and vomiting  HPI: This is a 38 year old female with past medical history significant for HTN, extra morbid obesity, PCOS, anxiety.  Approximately 2 weeks ago she noted she was getting extremely tired with normal activities.  She also noted he was drinking and peeing a lot.  She thought she had a yeast infection initially.  She used an over-the-counter cream with no effect.  On Monday she came to the ER, was prescribed Diflucan.  Wet prep done.  She went home, noted she still did not feel right.  She had cottonmouth, blurry vision, ongoing polyuria and polydipsia.  All she wanted to [place].  Today she took her children to therapy, therapist canceled the session stating the patient did not look right.  The patient then came to the ER.  In the ER patient 7.28, sodium 129, CO2 16, glucose 634, creatinine 1.47 [baseline normal], magnesium 1.13.  WBCs 11.5, hemoglobin 16.2, beta hydroxybutyric acid 7.59.  Admission requested for DKA.  Endotool initiated  Review of Systems:  Per HPI  Past Medical History: Past Medical History:  Diagnosis Date   Anxiety    Benign essential HTN    Depression    Dermoid cyst    per pt, left ovary   Family history of cardiomyopathy 05/30/2018   Migraines    Obesity    Polycystic ovarian disease    Postpartum state    Seasonal allergies    Vaginal Pap smear, abnormal    Past Surgical History:  Procedure Laterality Date   ADENOIDECTOMY     BIOPSY  05/25/2022   Procedure: BIOPSY;  Surgeon: Sherrilyn Rist, MD;  Location: [facility];  Service: Gastroenterology;;   CESAREAN SECTION MULTI-GESTATIONAL N/A 01/04/2018   Procedure: CESAREAN SECTION MULTI-GESTATIONAL;  Surgeon: Philip Aspen, DO;  Location: [facility];  Service: Obstetrics;  Laterality: N/A;   ESOPHAGOGASTRODUODENOSCOPY (EGD) WITH PROPOFOL N/A 05/25/2022   Procedure: ESOPHAGOGASTRODUODENOSCOPY (EGD) WITH PROPOFOL;  Surgeon: Sherrilyn Rist, MD;  Location: [facility];  Service: Gastroenterology;  Laterality: N/A;   TONSILLECTOMY     UNILATERAL SALPINGECTOMY Left 01/04/2018   Procedure: UNILATERAL SALPINGECTOMY;  Surgeon: Philip Aspen, DO;  Location: [facility];  Service: Obstetrics;  Laterality: Left;    Medications: Prior to Admission medications   Medication Sig Start Date End Date Taking? Authorizing Provider  Cholecalciferol (VITAMIN D3) 1.25 MG (50000 UT) CAPS TAKE 1 CAPSULE BY MOUTH ONE TIME PER WEEK 08/20/22   [name], MD  etonogestrel (NEXPLANON) 68 MG IMPL implant 1 each (68 mg total) by Subdermal route. 07/10/21   [name], MD  fluconazole (DIFLUCAN) 150 MG tablet Take 1 tablet (150 mg total) by mouth daily. 05/09/23   Fayrene Helper, PA-C  labetalol (NORMODYNE) 100 MG tablet Take 1 tablet (100 mg total) by mouth 2 (two) times daily. 09/24/21   [name], MD  lubiprostone (AMITIZA) 24 MCG capsule Take 1 capsule (24 mcg total) by mouth 2 (two) times daily with a meal. 09/24/21   [name], MD  nystatin cream (MYCOSTATIN) Apply 1 Application topically 4 (four) times daily. Apply to affected area every 4-6 hours x 10 days 05/09/23   Fayrene Helper, PA-C  ondansetron [facility]) 4 MG tablet Take 1 tablet (4 mg total) by mouth every 6 (six) hours as needed for nausea or vomiting. 03/14/22   Prosperi, Christian H, PA-C  pantoprazole (PROTONIX) 40 MG tablet Take 1  tablet (40 mg total) by mouth daily. 03/14/22   Prosperi, Christian H, PA-C  phentermine (ADIPEX-P) 37.5 MG tablet Take 1 tablet (37.5 mg total) by mouth daily before breakfast. 06/02/22   [name], MD  potassium chloride (KLOR-CON) 10 MEQ tablet TAKE 1 TABLET BY MOUTH 2 TIMES DAILY. 08/27/22   [name], MD  sucralfate (CARAFATE) 1 g tablet Take 1 tablet (1 g total) by mouth 3 (three) times daily as needed (with meals for stomach pain). 03/14/22   Prosperi, Ephriam Knuckles H, PA-C    Allergies:   Allergies  Allergen Reactions    Doxycycline Nausea And Vomiting    (patient notes that only the tablets have caused this)    Social History:  reports that she has quit smoking. Her smoking use included cigarettes. She has never been exposed to tobacco smoke. She has never used smokeless tobacco. She reports that she does not currently use alcohol. She reports that she does not use drugs.  Family History: Family History  Problem Relation Age of Onset   Heart disease Mother    Diabetes Mellitus I Maternal Grandmother    Lupus Maternal Grandmother    Diabetes Mellitus I Paternal Grandmother    Lupus Maternal Uncle    Colon cancer Neg Hx    Stomach cancer Neg Hx    Esophageal cancer Neg Hx    Liver cancer Neg Hx    Pancreatic cancer Neg Hx    Rectal cancer Neg Hx     Physical Exam: Vitals:   05/12/23 1607 05/12/23 1610 05/12/23 1757  BP: (!) 151/89  (!) 143/102  Pulse: (!) 129  (!) 127  Resp: 20  (!) 23  Temp: 98.1 F (36.7 C)    SpO2: 98%  100%  Weight:  (!) 148.8 kg   Height:  5\' 8"  (1.727 m)     General:  A A and O x 3, extreme obese obesity, in no distress Eyes: Pink conjunctiva, no scleral icterus ENT: Moist oral mucosa, neck supple, no thyromegaly Lungs: CTA, no use of assessor muscles Cardiovascular: Tachycardia, RRR Abdomen: soft, positive BS, nontender, not an acute abdomen GU: not examined Neuro: CN II - XII grossly intact, sensation intact Musculoskeletal: Moves all extremities equally.  No edema Skin: no rash, no subcutaneous crepitation, no decubitus Psych: appropriate patient   Labs on Admission:  Recent Labs    05/12/23 1634 05/12/23 1640 05/12/23 1745  NA 127* 128* 127*  K 4.8 4.7 4.8  CL 89*  --  93*  CO2 16*  --  13*  GLUCOSE 654*  --  590*  BUN 16  --  16  CREATININE 1.47*  --  1.41*  CALCIUM 9.5  --  9.3    Recent Labs    05/12/23 1634 05/12/23 1640  WBC 11.5*  --   NEUTROABS 7.5  --   HGB 16.2* 17.0*  HCT 46.9* 50.0*  MCV 79.0*  --   PLT 258  --     Micro  Results: Recent Results (from the past 240 hours)  Wet prep, genital     Status: Abnormal   Collection Time: 05/09/23 11:15 AM   Specimen: Vaginal  Result Value Ref Range Status   Yeast Wet Prep HPF POC NONE SEEN NONE SEEN Final   Trich, Wet Prep NONE SEEN NONE SEEN Final   Clue Cells Wet Prep HPF POC NONE SEEN NONE SEEN Final   WBC, Wet Prep HPF POC >=10 (A) <10 Final  Sperm NONE SEEN  Final    Comment: Performed at [facility], [address], [place], [place] [address]     Radiological Exams on Admission: No results found.  Assessment/Plan Present on Admission:  DKA (diabetic ketoacidosis) (HCC) -Patient with a previous diagnosis of diabetes.   -Endo tool initiated.  Serial BMP, mag and Phos -Nutrition consult placed, diabetic education   AKI -Secondary to hyperglycemia, polyuria.  Aggressive IV fluid hydration per DKA order set.  BMP in a.m.   Hypomagnesemia -Repleting IV.  Serial magnesium levels ordered   Vaginal yeast -Right superior results shows no yeast.  Diflucan not continued   Primary hypertension -Toprol XL 25mg  daily started, first dose now  OSA // OHS -Patient does have been tested, however will order CPAP machine care of sleep.  Patient advised to get a sleep study outpatient.   PCOS -Nexplanon implant   Extreme morbid obesity (HCC) -Weight loss discussed.  Patient plans to try semaglutide outpatient  Miranda Stewart 05/12/2023, 7:47 PM

## 2023-05-12 NOTE — ED Provider Notes (Signed)
 [facility] EMERGENCY DEPARTMENT AT [facility] Provider Note   CSN: [id number] Arrival date & time: 05/12/23  1458     History  Chief Complaint  Patient presents with   Weakness    Miranda Stewart is a 38 y.o. female history of prediabetes, hypertension here presenting with vomiting and increased thirstiness.  Patient states that she noticed she has a yeast infection and tried Monistat outpatient.  She states that 3 days ago, she came to the ER and was put on Diflucan.  She states that at that time she did not have her blood sugar checked.  She states that for about a week or so, she has been very thirsty.  She has also been very tired.  She started vomiting today so she came to the ER.  She states that she has a history of prediabetes but not on any medications.  The history is provided by the patient.       Home Medications Prior to Admission medications   Medication Sig Start Date End Date Taking? Authorizing Provider  Cholecalciferol (VITAMIN D3) 1.25 MG (50000 UT) CAPS TAKE 1 CAPSULE BY MOUTH ONE TIME PER WEEK 08/20/22   [name], MD  etonogestrel (NEXPLANON) 68 MG IMPL implant 1 each (68 mg total) by Subdermal route. 07/10/21   [name], MD  fluconazole (DIFLUCAN) 150 MG tablet Take 1 tablet (150 mg total) by mouth daily. 05/09/23   Fayrene Helper, PA-C  labetalol (NORMODYNE) 100 MG tablet Take 1 tablet (100 mg total) by mouth 2 (two) times daily. 09/24/21   [name], MD  lubiprostone (AMITIZA) 24 MCG capsule Take 1 capsule (24 mcg total) by mouth 2 (two) times daily with a meal. 09/24/21   [name], MD  nystatin cream (MYCOSTATIN) Apply 1 Application topically 4 (four) times daily. Apply to affected area every 4-6 hours x 10 days 05/09/23   Fayrene Helper, PA-C  ondansetron (ZOFRAN) 4 MG tablet Take 1 tablet (4 mg total) by mouth every 6 (six) hours as needed for nausea or vomiting. 03/14/22   Prosperi, Christian H, PA-C  pantoprazole (PROTONIX) 40 MG  tablet Take 1 tablet (40 mg total) by mouth daily. 03/14/22   Prosperi, Christian H, PA-C  phentermine (ADIPEX-P) 37.5 MG tablet Take 1 tablet (37.5 mg total) by mouth daily before breakfast. 06/02/22   [name], MD  potassium chloride (KLOR-CON) 10 MEQ tablet TAKE 1 TABLET BY MOUTH 2 TIMES DAILY. 08/27/22   [name], MD  sucralfate (CARAFATE) 1 g tablet Take 1 tablet (1 g total) by mouth 3 (three) times daily as needed (with meals for stomach pain). 03/14/22   Prosperi, Christian H, PA-C      Allergies    Doxycycline    Review of Systems   Review of Systems  Gastrointestinal:  Positive for vomiting.  Neurological:  Positive for weakness.  All other systems reviewed and are negative.   Physical Exam Updated Vital Signs BP (!) 143/102 (BP Location: Right Wrist)   Pulse (!) 127   Temp 98.1 F (36.7 C)   Resp (!) 23   Ht 5\' 8"  (1.727 m)   Wt (!) 148.8 kg   LMP  (Approximate)   SpO2 100%   BMI 49.87 kg/m  Physical Exam Vitals and nursing note reviewed.  Constitutional:      Comments: Dehydrated  HENT:     Head: Normocephalic.     Nose: Nose normal.  Mouth/Throat:     Mouth: Mucous membranes are dry.  Eyes:     Extraocular Movements: Extraocular movements intact.     Pupils: Pupils are equal, round, and reactive to light.  Cardiovascular:     Rate and Rhythm: Regular rhythm. Tachycardia present.     Pulses: Normal pulses.     Heart sounds: Normal heart sounds.  Pulmonary:     Effort: Pulmonary effort is normal.     Breath sounds: Normal breath sounds.  Abdominal:     General: Abdomen is flat.     Palpations: Abdomen is soft.  Musculoskeletal:        General: Normal range of motion.     Cervical back: Normal range of motion and neck supple.  Skin:    General: Skin is warm.     Capillary Refill: Capillary refill takes less than 2 seconds.  Neurological:     General: No focal deficit present.  Psychiatric:        Mood and Affect: Mood normal.     ED  Results / Procedures / Treatments   Labs (all labs ordered are listed, but only abnormal results are displayed) Labs Reviewed  BASIC METABOLIC PANEL - Abnormal; Notable for the following components:      Result Value   Sodium 127 (*)    Chloride 89 (*)    CO2 16 (*)    Glucose, Bld 654 (*)    Creatinine, Ser 1.47 (*)    GFR, Estimated 47 (*)    Anion gap 22 (*)    All other components within normal limits  CBC WITH DIFFERENTIAL/PLATELET - Abnormal; Notable for the following components:   WBC 11.5 (*)    RBC 5.94 (*)    Hemoglobin 16.2 (*)    HCT 46.9 (*)    MCV 79.0 (*)    All other components within normal limits  URINALYSIS, ROUTINE W REFLEX MICROSCOPIC - Abnormal; Notable for the following components:   Color, Urine STRAW (*)    Glucose, UA >=500 (*)    Hgb urine dipstick MODERATE (*)    Ketones, ur 80 (*)    All other components within normal limits  BETA-HYDROXYBUTYRIC ACID - Abnormal; Notable for the following components:   Beta-Hydroxybutyric Acid 7.59 (*)    All other components within normal limits  BASIC METABOLIC PANEL - Abnormal; Notable for the following components:   Sodium 127 (*)    Chloride 93 (*)    CO2 13 (*)    Glucose, Bld 590 (*)    Creatinine, Ser 1.41 (*)    GFR, Estimated 49 (*)    Anion gap 21 (*)    All other components within normal limits  CBG MONITORING, ED - Abnormal; Notable for the following components:   Glucose-Capillary 570 (*)    All other components within normal limits  CBG MONITORING, ED - Abnormal; Notable for the following components:   Glucose-Capillary >600 (*)    All other components within normal limits  I-STAT VENOUS BLOOD GAS, ED - Abnormal; Notable for the following components:   pCO2, Ven 33.5 (*)    pO2, Ven 29 (*)    Bicarbonate 16.1 (*)    TCO2 17 (*)    Acid-base deficit 9.0 (*)    Sodium 128 (*)    Calcium, Ion 1.13 (*)    HCT 50.0 (*)    Hemoglobin 17.0 (*)    All other components within normal limits   HCG, SERUM, QUALITATIVE  BASIC METABOLIC  PANEL  BASIC METABOLIC PANEL  BASIC METABOLIC PANEL  CBG MONITORING, ED    EKG EKG Interpretation Date/Time:  Thursday May 12 2023 16:21:40 EDT Ventricular Rate:  133 PR Interval:  118 QRS Duration:  82 QT Interval:  298 QTC Calculation: 443 R Axis:   19  Text Interpretation: Sinus tachycardia Right atrial enlargement Cannot rule out Anterior infarct , age undetermined Abnormal ECG When compared with ECG of 14-Mar-2022 18:58, Since last tracing rate faster Confirmed by Richardean Canal [phone]) on 05/12/2023 7:22:39 PM  Radiology No results found.  Procedures Procedures    CRITICAL CARE Performed by: Richardean Canal   Total critical care time: 38 minutes  Critical care time was exclusive of separately billable procedures and treating other patients.  Critical care was necessary to treat or prevent imminent or life-threatening deterioration.  Critical care was time spent personally by me on the following activities: development of treatment plan with patient and/or surrogate as well as nursing, discussions with consultants, evaluation of patient's response to treatment, examination of patient, obtaining history from patient or surrogate, ordering and performing treatments and interventions, ordering and review of laboratory studies, ordering and review of radiographic studies, pulse oximetry and re-evaluation of patient's condition.   Medications Ordered in ED Medications  insulin regular, human (MYXREDLIN) 100 units/ 100 mL infusion (9.5 Units/hr Intravenous New Bag/Given 05/12/23 1854)  lactated ringers infusion (has no administration in time range)  dextrose 5 % in lactated ringers infusion (0 mLs Intravenous Stopped 05/12/23 1903)  dextrose 50 % solution 0-50 mL (has no administration in time range)  potassium chloride 10 mEq in 100 mL IVPB (has no administration in time range)  sodium chloride 0.9 % bolus 1,000 mL (1,000 mLs  Intravenous New Bag/Given 05/12/23 1844)  lactated ringers bolus 2,976 mL (2,976 mLs Intravenous New Bag/Given 05/12/23 1903)  ondansetron (ZOFRAN) injection 4 mg (4 mg Intravenous Given 05/12/23 1842)    ED Course/ Medical Decision Making/ A&P                                 Medical Decision Making Miranda Stewart is a 38 y.o. female here presenting with increased fatigue and thirstiness.  Patient had a CBG greater than 600 in triage.  Concern for new onset diabetes with DKA.  Plan to get CBC and CMP and VBG and beta hydroxy.  Will initiate DKA protocol  7:23 PM I reviewed patient's labs and patient has bicarb of 13 with an anion gap of 21.  VBG showed pH of 7.28.  Patient also has elevated beta hydroxy of 7.5.  Patient was given IV bolus and started on IV insulin.  Patient will be admitted for new onset diabetes with DKA  Problems Addressed: Diabetic ketoacidosis without coma associated with type 2 diabetes mellitus (HCC): acute illness or injury  Amount and/or Complexity of Data Reviewed Labs: ordered. Decision-making details documented in ED Course.  Risk Prescription drug management.    Final Clinical Impression(s) / ED Diagnoses Final diagnoses:  None    Rx / DC Orders ED Discharge Orders     None         Charlynne Pander, MD 05/12/23 1924

## 2023-05-13 ENCOUNTER — Other Ambulatory Visit (HOSPITAL_COMMUNITY): Payer: Self-pay

## 2023-05-13 ENCOUNTER — Telehealth (HOSPITAL_COMMUNITY): Payer: Self-pay | Admitting: Pharmacy Technician

## 2023-05-13 ENCOUNTER — Encounter (HOSPITAL_COMMUNITY): Payer: Self-pay | Admitting: Family Medicine

## 2023-05-13 ENCOUNTER — Other Ambulatory Visit: Payer: Self-pay

## 2023-05-13 DIAGNOSIS — E1110 Type 2 diabetes mellitus with ketoacidosis without coma: Secondary | ICD-10-CM

## 2023-05-13 LAB — BASIC METABOLIC PANEL
Anion gap: 9 (ref 5–15)
Anion gap: 5 (ref 5–15)
BUN: 11 mg/dL (ref 6–20)
BUN: 8 mg/dL (ref 6–20)
CO2: 19 mmol/L — ABNORMAL LOW (ref 22–32)
CO2: 23 mmol/L (ref 22–32)
Calcium: 8.5 mg/dL — ABNORMAL LOW (ref 8.9–10.3)
Calcium: 8.3 mg/dL — ABNORMAL LOW (ref 8.9–10.3)
Chloride: 106 mmol/L (ref 98–111)
Chloride: 107 mmol/L (ref 98–111)
Creatinine, Ser: 0.89 mg/dL (ref 0.44–1.00)
Creatinine, Ser: 0.87 mg/dL (ref 0.44–1.00)
GFR, Estimated: >60 mL/min (ref >60)
GFR, Estimated: >60 mL/min (ref >60)
Glucose, Bld: 248 mg/dL — ABNORMAL HIGH (ref 70–99)
Glucose, Bld: 222 mg/dL — ABNORMAL HIGH (ref 70–99)
Potassium: 4 mmol/L (ref 3.5–5.1)
Potassium: 3.5 mmol/L (ref 3.5–5.1)
Sodium: 134 mmol/L — ABNORMAL LOW (ref 135–145)
Sodium: 135 mmol/L (ref 135–145)

## 2023-05-13 LAB — CBG MONITORING, ED
Glucose-Capillary: 226 mg/dL — ABNORMAL HIGH (ref 70–99)
Glucose-Capillary: 231 mg/dL — ABNORMAL HIGH (ref 70–99)
Glucose-Capillary: 233 mg/dL — ABNORMAL HIGH (ref 70–99)
Glucose-Capillary: 237 mg/dL — ABNORMAL HIGH (ref 70–99)
Glucose-Capillary: 242 mg/dL — ABNORMAL HIGH (ref 70–99)
Glucose-Capillary: 256 mg/dL — ABNORMAL HIGH (ref 70–99)
Glucose-Capillary: 265 mg/dL — ABNORMAL HIGH (ref 70–99)

## 2023-05-13 LAB — CBC WITH DIFFERENTIAL/PLATELET
Abs Immature Granulocytes: 0.03 10*3/uL (ref 0.00–0.07)
Basophils Absolute: 0 10*3/uL (ref 0.0–0.1)
Basophils Relative: 0 %
Eosinophils Absolute: 0 10*3/uL (ref 0.0–0.5)
Eosinophils Relative: 0 %
HCT: 39.4 % (ref 36.0–46.0)
Hemoglobin: 13.9 g/dL (ref 12.0–15.0)
Immature Granulocytes: 0 %
Lymphocytes Relative: 35 %
Lymphs Abs: 3.2 10*3/uL (ref 0.7–4.0)
MCH: 26.8 pg (ref 26.0–34.0)
MCHC: 35.3 g/dL (ref 30.0–36.0)
MCV: 76.1 fL — ABNORMAL LOW (ref 80.0–100.0)
Monocytes Absolute: 1.2 10*3/uL — ABNORMAL HIGH (ref 0.1–1.0)
Monocytes Relative: 13 %
Neutro Abs: 4.6 10*3/uL (ref 1.7–7.7)
Neutrophils Relative %: 52 %
Platelets: 218 10*3/uL (ref 150–400)
RBC: 5.18 MIL/uL — ABNORMAL HIGH (ref 3.87–5.11)
RDW: 12.1 % (ref 11.5–15.5)
WBC: 9.1 10*3/uL (ref 4.0–10.5)
nRBC: 0 % (ref 0.0–0.2)

## 2023-05-13 LAB — GLUCOSE, CAPILLARY
Glucose-Capillary: 340 mg/dL — ABNORMAL HIGH (ref 70–99)
Glucose-Capillary: 342 mg/dL — ABNORMAL HIGH (ref 70–99)
Glucose-Capillary: 319 mg/dL — ABNORMAL HIGH (ref 70–99)

## 2023-05-13 LAB — BETA-HYDROXYBUTYRIC ACID: Beta-Hydroxybutyric Acid: 1.4 mmol/L — ABNORMAL HIGH (ref 0.05–0.27)

## 2023-05-13 LAB — HEMOGLOBIN A1C
Hgb A1c MFr Bld: 13.2 % — ABNORMAL HIGH (ref 4.8–5.6)
Mean Plasma Glucose: 332.14 mg/dL

## 2023-05-13 LAB — MAGNESIUM: Magnesium: 1.7 mg/dL (ref 1.7–2.4)

## 2023-05-13 LAB — PHOSPHORUS: Phosphorus: 1.8 mg/dL — ABNORMAL LOW (ref 2.5–4.6)

## 2023-05-13 MED ORDER — LIVING WELL WITH DIABETES BOOK
Freq: Once | Status: AC
  Filled 2023-05-13: qty 1

## 2023-05-13 MED ORDER — POTASSIUM CHLORIDE CRYS ER 20 MEQ PO TBCR
40.0000 meq | EXTENDED_RELEASE_TABLET | Freq: Once | ORAL | Status: AC
  Administered 2023-05-13: 40 meq via ORAL
  Filled 2023-05-13: qty 2

## 2023-05-13 MED ORDER — AMLODIPINE BESYLATE 5 MG PO TABS
5.0000 mg | ORAL_TABLET | Freq: Every day | ORAL | Status: DC
  Administered 2023-05-13 – 2023-05-14 (×2): 5 mg via ORAL
  Filled 2023-05-13 (×2): qty 1

## 2023-05-13 MED ORDER — INSULIN ASPART 100 UNIT/ML IJ SOLN
0.0000 [IU] | Freq: Every day | INTRAMUSCULAR | Status: DC
Start: 2023-05-13 — End: 2023-05-14
  Administered 2023-05-13: 4 [IU] via SUBCUTANEOUS

## 2023-05-13 MED ORDER — INSULIN GLARGINE 100 UNIT/ML ~~LOC~~ SOLN
15.0000 [IU] | Freq: Every day | SUBCUTANEOUS | Status: DC
  Filled 2023-05-13: qty 0.15

## 2023-05-13 MED ORDER — K PHOS MONO-SOD PHOS DI & MONO 155-852-130 MG PO TABS
500.0000 mg | ORAL_TABLET | Freq: Three times a day (TID) | ORAL | Status: DC
  Administered 2023-05-13 (×3): 500 mg via ORAL
  Filled 2023-05-13 (×3): qty 2

## 2023-05-13 MED ORDER — LACTATED RINGERS IV SOLN: INTRAVENOUS | Status: DC

## 2023-05-13 MED ORDER — INSULIN STARTER KIT- PEN NEEDLES (ENGLISH)
1.0000 | Freq: Once | Status: AC
  Administered 2023-05-13: 1
  Filled 2023-05-13: qty 1

## 2023-05-13 MED ORDER — INFLUENZA VIRUS VACC SPLIT PF (FLUZONE) 0.5 ML IM SUSY: 0.5000 mL | PREFILLED_SYRINGE | INTRAMUSCULAR | Status: DC

## 2023-05-13 MED ORDER — INSULIN GLARGINE 100 UNIT/ML ~~LOC~~ SOLN: 10.0000 [IU] | Freq: Every day | SUBCUTANEOUS | Status: DC

## 2023-05-13 MED ORDER — PNEUMOCOCCAL 20-VAL CONJ VACC 0.5 ML IM SUSY
0.5000 mL | PREFILLED_SYRINGE | INTRAMUSCULAR | Status: DC
  Filled 2023-05-13: qty 0.5

## 2023-05-13 MED ORDER — INSULIN ASPART 100 UNIT/ML IJ SOLN
0.0000 [IU] | Freq: Three times a day (TID) | INTRAMUSCULAR | Status: DC
  Administered 2023-05-13 (×2): 11 [IU] via SUBCUTANEOUS
  Administered 2023-05-13 – 2023-05-14 (×2): 8 [IU] via SUBCUTANEOUS
  Administered 2023-05-14: 11 [IU] via SUBCUTANEOUS

## 2023-05-13 MED ORDER — INSULIN GLARGINE 100 UNIT/ML ~~LOC~~ SOLN
20.0000 [IU] | Freq: Every day | SUBCUTANEOUS | Status: DC
Start: 2023-05-14 — End: 2023-05-14
  Administered 2023-05-14: 20 [IU] via SUBCUTANEOUS
  Filled 2023-05-13: qty 0.2

## 2023-05-13 MED ORDER — INSULIN GLARGINE 100 UNIT/ML ~~LOC~~ SOLN
10.0000 [IU] | Freq: Once | SUBCUTANEOUS | Status: DC
  Administered 2023-05-13: 10 [IU] via SUBCUTANEOUS
  Filled 2023-05-13: qty 0.1

## 2023-05-13 MED ORDER — INSULIN GLARGINE 100 UNIT/ML ~~LOC~~ SOLN
10.0000 [IU] | Freq: Once | SUBCUTANEOUS | Status: AC
  Administered 2023-05-13: 10 [IU] via SUBCUTANEOUS
  Filled 2023-05-13: qty 0.1

## 2023-05-13 NOTE — Telephone Encounter (Signed)
 Pharmacy Patient Advocate Encounter  Received notification from [facility] MEDICAID that Prior Authorization for FreeStyle Libre 3 Sensor  has been APPROVED from 05/13/2023 to 11/13/2023. Ran test claim, Copay is $4.00. This test claim was processed through [facility]- copay amounts may vary at other pharmacies due to pharmacy/plan contracts, or as the patient moves through the different stages of their insurance plan.   PA #/Case ID/Reference #: [id number]

## 2023-05-13 NOTE — Progress Notes (Signed)
 PROGRESS NOTE  Miranda Stewart  BJY:[id number] DOB: Mar 02, 1985 DOA: 05/12/2023 PCP: [name], MD   Brief Narrative: Patient is a 38 year old female with history of hypertension, morbid obesity, PCOS, anxiety who presented with complaint of fatigue, polyuria.,  Polydipsia, blurry vision.  On presentation ,lab work showed sodium of 129, CO2 of 16, glucose of 634, creatinine of 1.4, magnesium of 1.1, elevated beta hydroxybutyric acid.  Admitted for the management of DKA, diabetic coordinator consulted.  A1c of 13.  Plan for discharge to home with insulin tomorrow  Assessment & Plan:  Principal Problem:   DKA (diabetic ketoacidosis) (HCC) Active Problems:   Primary hypertension   Morbid obesity (HCC)    DKA: New onset.  Presented with fatigue, polyuria, polydipsia, blurry vision.  Hyperglycemic on presentation.  Started on insulin drip.  A1c of more than 13.  Diabetic coordinator consulted.  Likely needs insulin on discharge.  Needs to receive diabetic education  AKI: Resolved with IV fluid  Hypomagnesemia: Supplemented  Hypophosphatemia: Supplemented  Hypertension: Blood pressure noted to be high on presentation.  Does not take any medication at home.  Started on amlod 5 mg  Morbid obesity: BMI of 49.8  Suspected underlying OSA/OHS: Needs sleep study as an outpatient       DVT prophylaxis:heparin injection 5,000 Units Start: 05/12/23 2200     Code Status: Full Code  Family Communication: Cousin at bedside  Patient status:Inpatient  Patient is from :home  Anticipated discharge YQ:MVHQ  Estimated DC date:tomorrow   Consultants: None  Procedures:None  Antimicrobials:  Anti-infectives (From admission, onward)    None       Subjective: Patient seen and examined at bedside today.  Hemodynamically stable.  Comfortable lying in bed.  Alert and oriented.  No nausea, vomiting or abdominal pain  Objective: Vitals:   05/13/23 0445 05/13/23 0457 05/13/23  0500 05/13/23 0515  BP: (!) 143/88  134/79 123/89  Pulse: 97  98 98  Resp: (!) 26  13 16   Temp:  98.6 F (37 C)    TempSrc:  Oral    SpO2: 90%  93% 100%  Weight:      Height:        Intake/Output Summary (Last 24 hours) at 05/13/2023 0738 Last data filed at 05/13/2023 4696 Gross per 24 hour  Intake 4389.58 ml  Output --  Net 4389.58 ml   Filed Weights   05/12/23 1610  Weight: (!) 148.8 kg    Examination:  General exam: Overall comfortable, not in distress, morbidly obese HEENT: PERRL Respiratory system:  no wheezes or crackles  Cardiovascular system: S1 & S2 heard, RRR.  Gastrointestinal system: Abdomen is nondistended, soft and nontender. Central nervous system: Alert and oriented Extremities: No edema, no clubbing ,no cyanosis Skin: No rashes, no ulcers,no icterus     Data Reviewed: I have personally reviewed following labs and imaging studies  CBC: Recent Labs  Lab 05/09/23 0913 05/12/23 1634 05/12/23 1640 05/13/23 0511  WBC 7.5 11.5*  --  9.1  NEUTROABS 4.9 7.5  --  4.6  HGB 15.7* 16.2* 17.0* 13.9  HCT 45.0 46.9* 50.0* 39.4  MCV 77.2* 79.0*  --  76.1*  PLT 254 258  --  218   Basic Metabolic Panel: Recent Labs  Lab 05/12/23 1634 05/12/23 1640 05/12/23 1745 05/12/23 2210 05/13/23 0151 05/13/23 0511  NA 127* 128* 127* 133* 134* 135  K 4.8 4.7 4.8 4.1 4.0 3.5  CL 89*  --  93* 104 106 107  CO2 16*  --  13* 14* 19* 23  GLUCOSE 654*  --  590* 334* 248* 222*  BUN 16  --  16 12 11 8   CREATININE 1.47*  --  1.41* 1.07* 0.89 0.87  CALCIUM 9.5  --  9.3 8.4* 8.5* 8.3*  MG  --   --   --   --  1.7  --   PHOS  --   --   --   --  1.8*  --      Recent Results (from the past 240 hours)  Wet prep, genital     Status: Abnormal   Collection Time: 05/09/23 11:15 AM   Specimen: Vaginal  Result Value Ref Range Status   Yeast Wet Prep HPF POC NONE SEEN NONE SEEN Final   Trich, Wet Prep NONE SEEN NONE SEEN Final   Clue Cells Wet Prep HPF POC NONE SEEN NONE SEEN  Final   WBC, Wet Prep HPF POC >=10 (A) <10 Final   Sperm NONE SEEN  Final    Comment: Performed at [facility], [address] [place], [place] [address]     Radiology Studies: No results found.  Scheduled Meds:  heparin  5,000 Units Subcutaneous Q8H   insulin aspart  0-15 Units Subcutaneous TID WC   insulin aspart  0-5 Units Subcutaneous QHS   insulin glargine  10 Units Subcutaneous Once   metoprolol succinate  25 mg Oral Daily   pantoprazole (PROTONIX) IV  40 mg Intravenous Q24H   Continuous Infusions:  dextrose 5% lactated ringers 125 mL/hr at 05/13/23 0027   insulin Stopped (05/13/23 0653)     LOS: 1 day   Burnadette Pop, MD [facility] P3/21/2025, 7:38 AM

## 2023-05-13 NOTE — Discharge Instructions (Signed)

## 2023-05-13 NOTE — ED Notes (Signed)
 Pt ambulated with steady gait to bathroom. This RN removed purewick.

## 2023-05-13 NOTE — Inpatient Diabetes Management (Addendum)
 Inpatient Diabetes Program Recommendations  AACE/ADA: New Consensus Statement on Inpatient Glycemic Control (2015)  Target Ranges:  Prepandial:   less than 140 mg/dL      Peak postprandial:   less than 180 mg/dL (1-2 hours)      Critically ill patients:  140 - 180 mg/dL   Lab Results  Component Value Date   GLUCAP 340 (H) 05/13/2023   HGBA1C 13.2 (H) 05/13/2023    Review of Glycemic Control  Diabetes history: New Onset DM2 Outpatient Diabetes medications: None Current orders for Inpatient glycemic control: Lantus 20 units every day, Novolog 0-15 units TID and 0-5 units at bedtime  Met with patient and aunt at bedside.  Spoke with pt about new diagnosis. Discussed A1C results with them and explained what an A1C is, basic pathophysiology of DM Type 2, basic home care, basic diabetes diet nutrition principles, importance of checking CBGs and maintaining good CBG control to prevent long-term and short-term complications. Reviewed signs and symptoms of hyperglycemia and hypoglycemia and how to treat hypoglycemia at home. Also reviewed blood sugar goals at home.  RNs to provide ongoing basic DM education at bedside with this patient. Have ordered educational booklet, insulin starter kit, and DM videos. Have also placed RD consult for DM diet education for this patient.   Educated patient on insulin pen use at home. Reviewed contents of insulin flexpen starter kit. Reviewed all steps of insulin pen including attachment of needle, 2-unit air shot, dialing up dose, giving injection, removing needle, disposal of sharps, storage of unused insulin, disposal of insulin etc. Patient able to provide successful return demonstration. Also reviewed troubleshooting with insulin pen. MD to give patient Rxs for insulin pens and insulin pen needles.  Lantus and Novolog are $4 with her insurance; our [facility] pharmacy is starting a PA on the [employer] 3.  Educated on The Plate Method, CHO's, portion control,  CBGs at home fasting and mid afternoon, F/U with PCP every 3 months, bring meter to PCP office, long and short term complications of uncontrolled BG, and importance of exercise.    Received verbal order from MD for placing a Freestyle Libre3 on patient.  Educated patient on use, application, monitoring of blood sugar and when to check a finger stick (if receiver alerts to or if she feels different from receiver reading).  Placed sensor on back of left arm.  Continue to monitor cbg's with finger sticks while inpatient.   She has a PCP appointment at the end of this month.  Asked her to take her phone with her for her PCP to review.  Asked her to speak with her PCP about starting a GLP1-RA.    Her sister in-law has DM2 and uses the insulin pen and will be a good support for her.    Discharge Recommendations: Long acting recommendations: Insulin Glargine (LANTUS) Solostar Pen to be determined  Short acting recommendations:  Meal + Correction coverage Insulin aspart (NOVOLOG) FlexPen  Moderate Scale.  meal coverage to be determined Supply/Referral recommendations: Glucometer Test strips Lancet device Lancets Pen needles - standard   Use Adult Diabetes Insulin Treatment Post Discharge order set.  Will continue to follow while inpatient.  Thank you, Dulce Sellar, MSN, CDCES Diabetes Coordinator Inpatient Diabetes Program [phone] (team pager from 8a-5p)

## 2023-05-13 NOTE — Telephone Encounter (Signed)
 Patient Product/process development scientist completed.    The patient is insured through [facility] MEDICAID.     Ran test claim for Lantus Pen and the current 30 day co-pay is $4.00.  Ran test claim for Novolog Flexpen and the current 30 day co-pay is $4.00  Ran test claim for Dexcom G7 Sensor and Requires Prior Authorization  Ran test claim for [employer] 3 Sensor and Requires Prior Authorization  This test claim was processed through [employer]- copay amounts may vary at other pharmacies due to [employer], or as the patient moves through the different stages of their insurance plan.     Roland Earl, CPHT Pharmacy Technician III Certified Patient Advocate [facility] Patient Advocate Team Direct Number: [phone]  Fax: [phone]

## 2023-05-13 NOTE — TOC Initial Note (Signed)
 Transition of Care [facility]) - Initial/Assessment Note    Patient Details  Name: Miranda Stewart MRN: [id number] Date of Birth: 06-13-1985  Transition of [facility]) CM/SW Contact:    Kermit Balo, RN Phone Number: 05/13/2023, 11:28 AM  Clinical Narrative:                  Pt is from home alone. She says her children's father is there sometimes.  She denies issues with transportation. She wasn't taking any prescription medications prior to admission. No DME.  TOC following.  Expected Discharge Plan: Home/Self Care Barriers to Discharge: Continued Medical Work up   Patient Goals and CMS Choice            Expected Discharge Plan and Services       Living arrangements for the past 2 months: Single Family Home                                      Prior Living Arrangements/Services Living arrangements for the past 2 months: Single Family Home Lives with:: Self Patient language and need for interpreter reviewed:: Yes Do you feel safe going back to the place where you live?: Yes        Care giver support system in place?: No (comment)   Criminal Activity/Legal Involvement Pertinent to Current Situation/Hospitalization: No - Comment as needed  Activities of Daily Living   ADL Screening (condition at time of admission) Independently performs ADLs?: Yes (appropriate for developmental age) Is the patient deaf or have difficulty hearing?: No Does the patient have difficulty seeing, even when wearing glasses/contacts?: No Does the patient have difficulty concentrating, remembering, or making decisions?: No  Permission Sought/Granted                  Emotional Assessment Appearance:: Appears stated age Attitude/Demeanor/Rapport: Engaged Affect (typically observed): Accepting Orientation: : Oriented to Self, Oriented to Place, Oriented to  Time, Oriented to Situation      Admission diagnosis:  DKA (diabetic ketoacidosis) (HCC) [E11.10] Diabetic  ketoacidosis without coma associated with type 2 diabetes mellitus (HCC) [E11.10] Patient Active Problem List   Diagnosis Date Noted   DKA (diabetic ketoacidosis) (HCC) 05/12/2023   Cellulitis of right buttock 06/01/2022   Nausea and vomiting 05/25/2022   Hypokalemia 03/18/2022   PUD (peptic ulcer disease) 03/18/2022   Bilateral carpal tunnel syndrome 09/24/2021   Chronic idiopathic constipation 09/24/2021   Vitamin D deficiency disease 06/26/2021   Primary hypertension 06/25/2021   Morbid obesity (HCC) 06/25/2021   Prediabetes 06/25/2021   Loud snoring 06/25/2021   LVH (left ventricular hypertrophy) due to hypertensive disease, without heart failure 06/25/2021   Family history of cardiomyopathy 05/30/2018   PCP:  [name], MD Pharmacy:   [id number] [place],  - [address] [facility] [address] [place] [address] Phone: [phone] Fax: [phone]  [facility] [address] [place] [address] Phone: [phone] Fax: [phone]     Social Drivers of Health (SDOH) Social History: SDOH Screenings   Food Insecurity: No Food Insecurity (05/13/2023)  Housing: Low Risk  (05/13/2023)  Transportation Needs: No Transportation Needs (05/13/2023)  Utilities: Not At Risk (05/13/2023)  Alcohol Screen: Low Risk  (05/31/2022)  Depression (PHQ2-9): Medium Risk (11/10/2021)  Financial Resource Strain: High Risk (05/31/2022)  Physical Activity: Insufficiently Active (05/31/2022)  Social Connections: Moderately Isolated (05/31/2022)  Stress: Stress Concern Present (05/31/2022)  Tobacco Use: Medium Risk (05/13/2023)   SDOH Interventions:     Readmission Risk Interventions     No data to display

## 2023-05-13 NOTE — Progress Notes (Signed)
 Brief Nutrition Education Consult  Pt with past medical history significant for HTN, extra morbid obesity, PCOS, anxiety  presenting with vomiting and increased thirstiness. She had been experiencing symptoms of had cottonmouth, blurry vision, ongoing polyuria and polydipsia PTA. Patient had a CBG greater than 600 in triage. Concern for new onset diabetes with DKA.   RD consulted for nutrition education regarding diabetes. Noted admission for DKA. DM coordinator consult pending.   Lab Results  Component Value Date   HGBA1C 13.2 (H) 05/13/2023   PTA DM medications are none.  Labs reviewed: CBGS: 231-265 (inpatient orders for glycemic control are 0-15 units insulin aspart TID with meals, 0-5 units insulin aspart daily at bedtime, and 0-20 units insulin glargine daily). Insulin drip d/c this AM.   Medications reviewed and include phosphorus.  Wt has been stable over the past year.   Pt unavailable at time of visit. Attempted to speak with pt via call to hospital room phone, however, unable to reach. RD unable to obtain further nutrition-related history or complete nutrition-focused physical exam at this time.    RD provided "Carbohydrate Counting for People with Diabetes" and "Plate Method" handouts from the Academy of Nutrition and Dietetics.  Body mass index is 49.87 kg/m. Pt meets criteria for obesity, class III based on current BMI. Obesity is a complex, chronic medical condition that is optimally managed by a multidisciplinary care team. Weight loss is not an ideal goal for an acute inpatient hospitalization. However, if further work-up for obesity is warranted, consider outpatient referral to [facility] Nutrition and Diabetes Education Services. Per H&P, pt considering semaglutide for weight management. RD has referred pt to [facility]'s Nutrition and Diabetes Education Services for further support and education as an outpatient for both DM and obesity management.    Current diet order  is heart healthy/ carb modified (liberalized diet to carb  modified for wider variety of meal selections), patient is consuming approximately n/a% of meals at this time. Labs and medications reviewed. No further nutrition interventions warranted at this time. RD contact information provided. If additional nutrition issues arise, please re-consult RD.  Levada Schilling, RD, LDN, CDCES Registered Dietitian III Certified Diabetes Care and Education Specialist If unable to reach this RD, please use "RD Inpatient" group chat on secure chat between hours of 8am-4 pm daily

## 2023-05-13 NOTE — Plan of Care (Signed)

## 2023-05-13 NOTE — Telephone Encounter (Signed)
 Pharmacy Patient Advocate Encounter   Received notification that prior authorization for FreeStyle Libre 3 Sensor is required/requested.   Insurance verification completed.   The patient is insured through [facility] MEDICAID .   Per test claim: PA required; PA submitted to above mentioned insurance via CoverMyMeds Key/confirmation #/EOC [id number] Status is pending
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
## 2023-05-14 ENCOUNTER — Other Ambulatory Visit (HOSPITAL_COMMUNITY): Payer: Self-pay

## 2023-05-14 DIAGNOSIS — E1110 Type 2 diabetes mellitus with ketoacidosis without coma: Secondary | ICD-10-CM | POA: Diagnosis not present

## 2023-05-14 LAB — BASIC METABOLIC PANEL
Anion gap: 13 (ref 5–15)
BUN: 8 mg/dL (ref 6–20)
CO2: 18 mmol/L — ABNORMAL LOW (ref 22–32)
Calcium: 8.5 mg/dL — ABNORMAL LOW (ref 8.9–10.3)
Chloride: 105 mmol/L (ref 98–111)
Creatinine, Ser: 0.84 mg/dL (ref 0.44–1.00)
GFR, Estimated: >60 mL/min (ref >60)
Glucose, Bld: 324 mg/dL — ABNORMAL HIGH (ref 70–99)
Potassium: 3.4 mmol/L — ABNORMAL LOW (ref 3.5–5.1)
Sodium: 136 mmol/L (ref 135–145)

## 2023-05-14 LAB — MAGNESIUM: Magnesium: 1.5 mg/dL — ABNORMAL LOW (ref 1.7–2.4)

## 2023-05-14 LAB — PHOSPHORUS: Phosphorus: 3.1 mg/dL (ref 2.5–4.6)

## 2023-05-14 LAB — GLUCOSE, CAPILLARY
Glucose-Capillary: 324 mg/dL — ABNORMAL HIGH (ref 70–99)
Glucose-Capillary: 296 mg/dL — ABNORMAL HIGH (ref 70–99)

## 2023-05-14 MED ORDER — INSULIN GLARGINE 100 UNIT/ML ~~LOC~~ SOLN
15.0000 [IU] | Freq: Once | SUBCUTANEOUS | Status: AC
  Administered 2023-05-14: 15 [IU] via SUBCUTANEOUS
  Filled 2023-05-14: qty 0.15

## 2023-05-14 MED ORDER — POTASSIUM CHLORIDE CRYS ER 20 MEQ PO TBCR
40.0000 meq | EXTENDED_RELEASE_TABLET | Freq: Every day | ORAL | 0 refills | Status: DC
  Filled 2023-05-14: qty 6, 3d supply, fill #0

## 2023-05-14 MED ORDER — AMLODIPINE BESYLATE 5 MG PO TABS
5.0000 mg | ORAL_TABLET | Freq: Every day | ORAL | 0 refills | Status: DC
Start: 2023-05-15 — End: 2023-06-06
  Filled 2023-05-14: qty 30, 30d supply, fill #0

## 2023-05-14 MED ORDER — INSULIN GLARGINE 100 UNIT/ML ~~LOC~~ SOLN: 35.0000 [IU] | Freq: Every day | SUBCUTANEOUS | Status: DC

## 2023-05-14 MED ORDER — BLOOD GLUCOSE MONITOR SYSTEM W/DEVICE KIT
1.0000 | PACK | Freq: Three times a day (TID) | 0 refills | Status: AC
  Filled 2023-05-14: qty 1, 30d supply, fill #0

## 2023-05-14 MED ORDER — BLOOD GLUCOSE TEST VI STRP
1.0000 | ORAL_STRIP | Freq: Three times a day (TID) | 0 refills | Status: DC
  Filled 2023-05-14: qty 100, 30d supply, fill #0

## 2023-05-14 MED ORDER — INSULIN ASPART 100 UNIT/ML FLEXPEN
5.0000 [IU] | PEN_INJECTOR | Freq: Three times a day (TID) | SUBCUTANEOUS | 0 refills | Status: DC
  Filled 2023-05-14: qty 6, 30d supply, fill #0

## 2023-05-14 MED ORDER — LANCETS MISC
1.0000 | Freq: Three times a day (TID) | 0 refills | Status: DC
  Filled 2023-05-14: qty 100, 30d supply, fill #0

## 2023-05-14 MED ORDER — INSULIN ASPART 100 UNIT/ML IJ SOLN
5.0000 [IU] | Freq: Three times a day (TID) | INTRAMUSCULAR | Status: DC
  Administered 2023-05-14: 5 [IU] via SUBCUTANEOUS

## 2023-05-14 MED ORDER — INSULIN PEN NEEDLE 31G X 8 MM MISC
1.0000 | Freq: Three times a day (TID) | 0 refills | Status: AC
  Filled 2023-05-14: qty 100, 30d supply, fill #0

## 2023-05-14 MED ORDER — LANCET DEVICE MISC
1.0000 | Freq: Three times a day (TID) | 0 refills | Status: AC
  Filled 2023-05-14: qty 1, 30d supply, fill #0

## 2023-05-14 MED ORDER — INSULIN GLARGINE 100 UNIT/ML SOLOSTAR PEN
35.0000 [IU] | PEN_INJECTOR | Freq: Every day | SUBCUTANEOUS | 0 refills | Status: DC
  Filled 2023-05-14: qty 12, 34d supply, fill #0

## 2023-05-14 MED ORDER — MAGNESIUM SULFATE 2 GM/50ML IV SOLN
2.0000 g | Freq: Once | INTRAVENOUS | Status: AC
  Administered 2023-05-14: 2 g via INTRAVENOUS
  Filled 2023-05-14: qty 50

## 2023-05-14 MED ORDER — POTASSIUM CHLORIDE CRYS ER 20 MEQ PO TBCR
40.0000 meq | EXTENDED_RELEASE_TABLET | Freq: Once | ORAL | Status: AC
  Administered 2023-05-14: 40 meq via ORAL
  Filled 2023-05-14: qty 2

## 2023-05-14 MED ORDER — BLOOD PRESSURE KIT
1.0000 | PACK | Freq: Two times a day (BID) | 0 refills | Status: AC
  Filled 2023-05-14: qty 1, fill #0

## 2023-05-14 NOTE — Plan of Care (Signed)

## 2023-05-14 NOTE — Discharge Summary (Signed)
 Physician Discharge Summary  Miranda Stewart XBJ:[id number] DOB: 1985-11-27 DOA: 05/12/2023  PCP: [name], MD  Admit date: 05/12/2023 Discharge date: 05/14/2023  Admitted From: Home Disposition:  Home  Discharge Condition:Stable CODE STATUS:FULL Diet recommendation: Carb Modified   Brief/Interim Summary: Patient is a 38 year old female with history of hypertension, morbid obesity, PCOS, anxiety who presented with complaint of fatigue, polyuria.,  Polydipsia, blurry vision.  On presentation ,lab work showed sodium of 129, CO2 of 16, glucose of 634, creatinine of 1.4, magnesium of 1.1, elevated beta hydroxybutyric acid.  Admitted for the management of DKA, diabetic coordinator consulted.  A1c of 13.  Plan for discharge to home with insulin today  Following problems were addressed during the hospitalization:  DKA: New onset.  Presented with fatigue, polyuria, polydipsia, blurry vision.  Hyperglycemic on presentation.  Started on insulin drip.  A1c of more than 13.  Diabetic coordinator consulted.  Needs insulin on discharge   AKI: Resolved with IV fluid   Hypomagnesemia: Supplemented   Hypophosphatemia: Supplemented  Hypokalemia: Supplemented   Hypertension: Blood pressure noted to be high on presentation.  Does not take any medication at home.  Started on amlod 5 mg   Morbid obesity: BMI of 49.8   Suspected underlying OSA/OHS: Needs sleep study as an outpatient   Discharge Diagnoses:  Principal Problem:   DKA (diabetic ketoacidosis) (HCC) Active Problems:   Primary hypertension   Morbid obesity (HCC)    Discharge Instructions  Discharge Instructions     Amb Referral to Nutrition and Diabetic Education   Complete by: As directed    Amb ref to Medical Nutrition Therapy-MNT   Complete by: As directed    Choose the type of MNT and number of hours: Initial MNT:  3 hours   Diet Carb Modified   Complete by: As directed    Discharge instructions   Complete by: As  directed    1)Please monitor your blood sugars at home.  Take your insulin as instructed 2)Monitor blood pressure at home 3)Follow up with your PCP in a week   Increase activity slowly   Complete by: As directed       Allergies as of 05/14/2023       Reactions   Doxycycline Nausea And Vomiting   (patient notes that only the tablets have caused this)        Medication List     TAKE these medications    amLODipine 5 MG tablet Commonly known as: NORVASC Take 1 tablet (5 mg total) by mouth daily. Start taking on: May 15, 2023   Blood Glucose Monitoring Suppl Devi 1 each by Does not apply route 3 (three) times daily. May dispense any manufacturer covered by patient's insurance.   BLOOD GLUCOSE TEST STRIPS Strp 1 each by Does not apply route 3 (three) times daily. Use as directed to check blood sugar. May dispense any manufacturer covered by patient's insurance and fits patient's device.   Blood Pressure Kit 1 each by Does not apply route 2 (two) times daily.   etonogestrel 68 MG Impl implant Commonly known as: NEXPLANON 1 each (68 mg total) by Subdermal route.   fluconazole 150 MG tablet Commonly known as: Diflucan Take 1 tablet (150 mg total) by mouth daily.   insulin aspart 100 UNIT/ML FlexPen Commonly known as: NOVOLOG Inject 5 Units into the skin 3 (three) times daily with meals. If eating and Blood Glucose (BG) 80 or higher inject 5 units for meal coverage and add correction  dose per scale. If not eating, correction dose only. BG <150= 0 unit; BG 150-200= 1 unit; BG 201-250= 2 unit; BG 251-300= 3 unit; BG 301-350= 4 unit; BG 351-400= 5 unit; BG >400= 6 unit and Call Primary Care.   insulin glargine 100 UNIT/ML Solostar Pen Commonly known as: LANTUS Inject 35 Units into the skin daily. May substitute as needed per insurance.   Lancet Device Misc 1 each by Does not apply route 3 (three) times daily. May dispense any manufacturer covered by patient's insurance.    Lancets Misc 1 each by Does not apply route 3 (three) times daily. Use as directed to check blood sugar. May dispense any manufacturer covered by patient's insurance and fits patient's device.   nystatin cream Commonly known as: MYCOSTATIN Apply 1 Application topically 4 (four) times daily. Apply to affected area every 4-6 hours x 10 days   Pen Needles 31G X 5 MM Misc 1 each by Does not apply route 3 (three) times daily. May dispense any manufacturer covered by patient's insurance.   potassium chloride SA 20 MEQ tablet Commonly known as: KLOR-CON M Take 2 tablets (40 mEq total) by mouth daily for 3 days. Start taking on: May 15, 2023        Follow-up Information     [name], MD. Schedule an appointment as soon as possible for a visit in 1 week(s).   Specialty: Internal Medicine Contact information: [address] [place] [address] [phone]                Allergies  Allergen Reactions   Doxycycline Nausea And Vomiting    (patient notes that only the tablets have caused this)    Consultations: None   Procedures/Studies: No results found.    Subjective: Patient seen and examined at bedside today. Comfortable.  Medically stable for discharge today with  insulin  Discharge Exam: Vitals:   05/14/23 0518 05/14/23 0733  BP: (!) 115/55 125/63  Pulse: 90 (!) 105  Resp: 18 18  Temp: 98.9 F (37.2 C) 98.9 F (37.2 C)  SpO2: 99% 99%   Vitals:   05/13/23 2016 05/14/23 0021 05/14/23 0518 05/14/23 0733  BP: (!) 158/87 128/75 (!) 115/55 125/63  Pulse: (!) 111 (!) 105 90 (!) 105  Resp: 18 19 18 18   Temp: 99.7 F (37.6 C) 99.3 F (37.4 C) 98.9 F (37.2 C) 98.9 F (37.2 C)  TempSrc: Oral Oral Oral Oral  SpO2: 99% 100% 99% 99%  Weight:      Height:        General: Pt is alert, awake, not in acute distress Cardiovascular: RRR, S1/S2 +, no rubs, no gallops Respiratory: CTA bilaterally, no wheezing, no rhonchi Abdominal: Soft, NT,  ND, bowel sounds + Extremities: no edema, no cyanosis    The results of significant diagnostics from this hospitalization (including imaging, microbiology, ancillary and laboratory) are listed below for reference.     Microbiology: Recent Results (from the past 240 hours)  Wet prep, genital     Status: Abnormal   Collection Time: 05/09/23 11:15 AM   Specimen: Vaginal  Result Value Ref Range Status   Yeast Wet Prep HPF POC NONE SEEN NONE SEEN Final   Trich, Wet Prep NONE SEEN NONE SEEN Final   Clue Cells Wet Prep HPF POC NONE SEEN NONE SEEN Final   WBC, Wet Prep HPF POC >=10 (A) <10 Final   Sperm NONE SEEN  Final    Comment: Performed at  [facility], [address], [place], [place] [address]     Labs: BNP (last 3 results) No results for input(s): "BNP" in the last 8760 hours. Basic Metabolic Panel: Recent Labs  Lab 05/12/23 1745 05/12/23 2210 05/13/23 0151 05/13/23 0511 05/14/23 0406  NA 127* 133* 134* 135 136  K 4.8 4.1 4.0 3.5 3.4*  CL 93* 104 106 107 105  CO2 13* 14* 19* 23 18*  GLUCOSE 590* 334* 248* 222* 324*  BUN 16 12 11 8 8   CREATININE 1.41* 1.07* 0.89 0.87 0.84  CALCIUM 9.3 8.4* 8.5* 8.3* 8.5*  MG  --   --  1.7  --  1.5*  PHOS  --   --  1.8*  --  3.1   Liver Function Tests: No results for input(s): "AST", "ALT", "ALKPHOS", "BILITOT", "PROT", "ALBUMIN" in the last 168 hours. No results for input(s): "LIPASE", "AMYLASE" in the last 168 hours. No results for input(s): "AMMONIA" in the last 168 hours. CBC: Recent Labs  Lab 05/09/23 0913 05/12/23 1634 05/12/23 1640 05/13/23 0511  WBC 7.5 11.5*  --  9.1  NEUTROABS 4.9 7.5  --  4.6  HGB 15.7* 16.2* 17.0* 13.9  HCT 45.0 46.9* 50.0* 39.4  MCV 77.2* 79.0*  --  76.1*  PLT 254 258  --  218   Cardiac Enzymes: No results for input(s): "CKTOTAL", "CKMB", "CKMBINDEX", "TROPONINI" in the last 168 hours. BNP: Invalid input(s): "POCBNP" CBG: Recent Labs  Lab 05/13/23 0749 05/13/23 1146 05/13/23 1533  05/13/23 2017 05/14/23 0732  GLUCAP 256* 340* 342* 319* 324*   D-Dimer No results for input(s): "DDIMER" in the last 72 hours. Hgb A1c Recent Labs    05/13/23 0658  HGBA1C 13.2*   Lipid Profile No results for input(s): "CHOL", "HDL", "LDLCALC", "TRIG", "CHOLHDL", "LDLDIRECT" in the last 72 hours. Thyroid function studies No results for input(s): "TSH", "T4TOTAL", "T3FREE", "THYROIDAB" in the last 72 hours.  Invalid input(s): "FREET3" Anemia work up No results for input(s): "VITAMINB12", "FOLATE", "FERRITIN", "TIBC", "IRON", "RETICCTPCT" in the last 72 hours. Urinalysis    Component Value Date/Time   COLORURINE STRAW (A) 05/12/2023 1623   APPEARANCEUR CLEAR 05/12/2023 1623   LABSPEC 1.030 05/12/2023 1623   PHURINE 5.0 05/12/2023 1623   GLUCOSEU >=500 (A) 05/12/2023 1623   GLUCOSEU NEGATIVE 06/25/2021 0845   HGBUR MODERATE (A) 05/12/2023 1623   BILIRUBINUR NEGATIVE 05/12/2023 1623   BILIRUBINUR neg 08/30/2011 1941   KETONESUR 80 (A) 05/12/2023 1623   PROTEINUR NEGATIVE 05/12/2023 1623   UROBILINOGEN 0.2 06/25/2021 0845   NITRITE NEGATIVE 05/12/2023 1623   LEUKOCYTESUR NEGATIVE 05/12/2023 1623   Sepsis Labs Recent Labs  Lab 05/09/23 0913 05/12/23 1634 05/13/23 0511  WBC 7.5 11.5* 9.1   Microbiology Recent Results (from the past 240 hours)  Wet prep, genital     Status: Abnormal   Collection Time: 05/09/23 11:15 AM   Specimen: Vaginal  Result Value Ref Range Status   Yeast Wet Prep HPF POC NONE SEEN NONE SEEN Final   Trich, Wet Prep NONE SEEN NONE SEEN Final   Clue Cells Wet Prep HPF POC NONE SEEN NONE SEEN Final   WBC, Wet Prep HPF POC >=10 (A) <10 Final   Sperm NONE SEEN  Final    Comment: Performed at [facility], [address], [place], [place] [address]    Please note: You were cared for by a hospitalist during your hospital stay. Once you are discharged, your primary care physician will handle  any further medical issues. Please note that NO  REFILLS for any discharge medications will be authorized once you are discharged, as it is imperative that you return to your primary care physician (or establish a relationship with a primary care physician if you do not have one) for your post hospital discharge needs so that they can reassess your need for medications and monitor your lab values.    Time coordinating discharge: 40 minutes  SIGNED:   Burnadette Pop, MD  [facility] 05/14/2023, 10:51 AM Pager [phone]  If 7PM-7AM, please contact night-coverage [email or web address] Password [email or web address]

## 2023-05-14 NOTE — Inpatient Diabetes Management (Signed)
 Inpatient Diabetes Program Recommendations  AACE/ADA: New Consensus Statement on Inpatient Glycemic Control  Target Ranges:  Prepandial:   less than 140 mg/dL      Peak postprandial:   less than 180 mg/dL (1-2 hours)      Critically ill patients:  140 - 180 mg/dL    Latest Reference Range & Units 05/13/23 07:49 05/13/23 11:46 05/13/23 15:33 05/13/23 20:17 05/14/23 07:32  Glucose-Capillary 70 - 99 mg/dL 540 (H) 981 (H) 191 (H) 319 (H) 324 (H)    Latest Reference Range & Units 05/13/23 06:58  Hemoglobin A1C 4.8 - 5.6 % 13.2 (H)   Review of Glycemic Control  Diabetes history: No; new DM dx this admission Outpatient Diabetes medications: NA Current orders for Inpatient glycemic control: Lantus 20 units daily, Novolog 0-15 units TID with meals, Novolog 0-5 units QHS  Inpatient Diabetes Program Recommendations:    Insulin: Please consider increasing Lantus to 35 units daily (if agreeable order one time Lanus 15 units x1 now - since already given 20 units this morning) and adding Novolog 5 units TID with meals for meal coverage if patient eats at least 50% of meals.  Outpatient DM: At time of discharge would recommend to discharge on Lantus 35 units daily, Novolog 5 units TID with meals, plus moderate correction scale TID with meals.  Discharge Recommendations: Other recommendations: Freestyle Libre 3 Order # [id number] Long acting recommendations: Insulin Glargine (LANTUS) Solostar Pen to be determined  Short acting recommendations:  Meal + Correction coverage Insulin aspart (NOVOLOG) FlexPen  Moderate Scale.  meal coverage to be determined Supply/Referral recommendations: Glucometer Test strips Lancet device Lancets Pen needles - standard   Use Adult Diabetes Insulin Treatment Post Discharge order set.  NOTE: Noted consult for Diabetes Coordinator. Diabetes Coordinator is not on campus over the weekend but available by pager from 8am to 5pm for questions or concerns.    Thanks, Orlando Penner, RN, MSN, CDCES Diabetes Coordinator Inpatient Diabetes Program [phone] (Team Pager from 8am to 5pm)

## 2023-05-16 ENCOUNTER — Other Ambulatory Visit (HOSPITAL_COMMUNITY): Payer: Self-pay

## 2023-05-16 ENCOUNTER — Telehealth: Payer: Self-pay | Admitting: *Deleted

## 2023-05-16 NOTE — Transitions of Care (Post Inpatient/ED Visit) (Signed)
 05/16/2023  Name: Miranda Stewart MRN: [id number] DOB: [birth year]  Today's TOC FU Call Status: Today's TOC FU Call Status:: Successful TOC FU Call Completed TOC FU Call Complete Date: 05/16/23 Patient's Name and Date of Birth confirmed.  Transition Care Management Follow-up Telephone Call Date of Discharge: 05/14/23 Discharge Facility: [facility] Type of Discharge: Inpatient Admission Primary Inpatient Discharge Diagnosis:: DKA (diabetic ketoacidosis How have you been since you were released from the hospital?: Better Any questions or concerns?: No  Items Reviewed: Did you receive and understand the discharge instructions provided?: Yes Medications obtained,verified, and reconciled?: Yes (Medications Reviewed) Any new allergies since your discharge?: No Dietary orders reviewed?: Yes Type of Diet Ordered:: Carb Modified Do you have support at home?: Yes People in Home: significant other, child(ren), dependent Name of Support/Comfort Primary Source: Miranda Stewart  Medications Reviewed Today: Medications Reviewed Today     Reviewed by Luella Cook, RN (Case Manager) on 05/16/23 at 1223  Med List Status: <None>   Medication Order Taking? Sig Documenting Provider Last Dose Status Informant  amLODipine (NORVASC) 5 MG tablet [id number] Yes Take 1 tablet (5 mg total) by mouth daily. Burnadette Pop, MD Taking Active   Blood Glucose Monitoring Suppl (BLOOD GLUCOSE MONITOR SYSTEM) w/Device KIT [id number] Yes 1 each by Does not apply route 3 (three) times daily. May dispense any manufacturer covered by patient's insurance. Burnadette Pop, MD Taking Active   Blood Pressure KIT [id number] Yes 1 each by Does not apply route 2 (two) times daily. Burnadette Pop, MD Taking Active   etonogestrel (NEXPLANON) 68 MG IMPL implant [id number] Yes 1 each (68 mg total) by Subdermal route. [name], MD Taking Active Self, Pharmacy Records           Med Note [facility], SEBASTIAN    Thu May 12, 2023  8:05 PM) In place  fluconazole (DIFLUCAN) 150 MG tablet [id number] No Take 1 tablet (150 mg total) by mouth daily.  Patient not taking: Reported on 05/12/2023   Miranda Helper, PA-C Not Taking Active Self, Pharmacy Records           Med Note Miranda Stewart, SEBASTIAN   Thu May 12, 2023  8:05 PM) Therapy completed on 05/09/23, single dose course  Glucose Blood (BLOOD GLUCOSE TEST STRIPS) STRP [id number] Yes 1 each by Does not apply route 3 (three) times daily. Use as directed to check blood sugar. May dispense any manufacturer covered by patient's insurance and fits patient's device. Burnadette Pop, MD Taking Active   insulin aspart (NOVOLOG) 100 UNIT/ML FlexPen [id number] Yes Inject 5 Units into the skin 3 (three) times daily with meals. If eating and Blood Glucose (BG) 80 or higher inject 5 units for meal coverage and add correction dose per scale. If not eating, correction dose only. BG <150= 0 unit; BG 150-200= 1 unit; BG 201-250= 2 unit; BG 251-300= 3 unit; BG 301-350= 4 unit; BG 351-400= 5 unit; BG >400= 6 unit and Call Primary Care. Burnadette Pop, MD Taking Active   insulin glargine (LANTUS) 100 UNIT/ML Solostar Pen [id number] Yes Inject 35 Units into the skin daily. May substitute as needed per insurance. Burnadette Pop, MD Taking Active   Insulin Pen Needle 31G X 8 MM MISC [id number] Yes 1 each by Does not apply route 3 (three) times daily. May dispense any manufacturer covered by patient's insurance. Burnadette Pop, MD Taking Active   Lancet Device MISC [id number] Yes Use 1 each 3 (three) times daily. Burnadette Pop, MD Taking Active  Lancets MISC [id number] Yes 1 each by Does not apply route 3 (three) times daily. Use as directed to check blood sugar. May dispense any manufacturer covered by patient's insurance and fits patient's device. Burnadette Pop, MD Taking Active   nystatin cream (MYCOSTATIN) [id number]  Apply 1 Application topically 4 (four) times daily. Apply to affected area  every 4-6 hours x 10 days Miranda Helper, PA-C  Active Self, Pharmacy Records  potassium chloride SA (KLOR-CON M) 20 MEQ tablet [id number] Yes Take 2 tablets (40 mEq total) by mouth daily for 3 days. Burnadette Pop, MD Taking Active             Home Care and Equipment/Supplies: Were Home Health Services Ordered?: NA Any new equipment or medical supplies ordered?: NA  Functional Questionnaire: Do you need assistance with bathing/showering or dressing?: No Do you need assistance with meal preparation?: No Do you need assistance with eating?: No Do you have difficulty maintaining continence: No Do you need assistance with getting out of bed/getting out of a chair/moving?: No Do you have difficulty managing or taking your medications?: No  Follow up appointments reviewed: PCP Follow-up appointment confirmed?: Yes Date of PCP follow-up appointment?: 05/23/23 Follow-up Provider: Dr Sanda Linger Specialist [facility] Follow-up appointment confirmed?: NA Do you need transportation to your follow-up appointment?: No Do you understand care options if your condition(s) worsen?: Yes-patient verbalized understanding  SDOH Interventions Today    Flowsheet Row Most Recent Value  SDOH Interventions   Food Insecurity Interventions Intervention Not Indicated  Housing Interventions Intervention Not Indicated  Transportation Interventions Intervention Not Indicated  Utilities Interventions Intervention Not Indicated      Interventions Today    Flowsheet Row Most Recent Value  Chronic Disease   Chronic disease during today's visit Diabetes  [DKA (diabetic ketoacidosis]  General Interventions   General Interventions Discussed/Reviewed General Interventions Discussed, General Interventions Reviewed, Doctor Visits, Referral to Nurse, Labs  [Referred patient to Ricke Hey case manager]  Labs Hgb A1c every 6 months  [Discussed what her A1C and the importance of getting it down.]  Doctor Visits  Discussed/Reviewed Doctor Visits Discussed, Doctor Visits Reviewed, PCP  PCP/Specialist Visits Compliance with follow-up visit  Education Interventions   Education Provided Provided Education  Provided Verbal Education On Blood Sugar Monitoring, Other, Insurance Plans  [monitoring BS, BP. Following up with diabetes nutrionist and education. Discussed the free style Libre]  Nutrition Interventions   Nutrition Discussed/Reviewed Nutrition Discussed, Nutrition Reviewed  Pharmacy Interventions   Pharmacy Dicussed/Reviewed Pharmacy Topics Discussed, Pharmacy Topics Reviewed       Goals Addressed             This Visit's Progress    TOC care plan       Current Barriers:  Knowledge Deficits related to plan of care for management of DMII   RNCM Clinical Goal(s):  Patient will work with the Care Management team over the next 30 days to address Transition of Care Barriers: Medication Management take all medications exactly as prescribed and will call provider for medication related questions as evidenced by EMR attend all scheduled medical appointments: PCP and Specialist  as evidenced by Electronic Medical Record  through collaboration with RN Care manager, provider, and care team.   Interventions: Evaluation of current treatment plan related to  self management and patient's adherence to plan as established by provider   Diabetes Interventions:  (Status:  New goal.) Short Term Goal Assessed patient's understanding of A1c goal: <8% Reviewed medications with patient and  discussed importance of medication adherence Discussed plans with patient for ongoing care management follow up and provided patient with direct contact information for care management team Reviewed scheduled/upcoming provider appointments including: [id number] Advised patient, providing education and rationale, to check cbg 4 and record, calling PCP for findings outside established parameters Assessed social determinant of  health barriers Lab Results  Component Value Date   HGBA1C 13.2 (H) 05/13/2023    Patient Goals/Self-Care Activities: Participate in Transition of Care Program/Attend [facility] scheduled calls Notify RN Care Manager of TOC call rescheduling needs Take all medications as prescribed Attend all scheduled provider appointments Call pharmacy for medication refills 3-7 days in advance of running out of medications Call provider office for new concerns or questions  check blood sugar at prescribed times: before meals and at bedtime enter blood sugar readings and medication or insulin into daily log take the blood sugar log to all doctor visits set a realistic goal Follow up with  Diabetes nutrition and education classes  Follow Up Plan:  Telephone follow up appointment with care management team member scheduled for:  Ricke Hey 04/02/202356 11:00 The patient has been provided with contact information for the care management team and has been advised to call with any health related questions or concerns.  Next PCP appointment scheduled for:          Gean Maidens BSN RN [facility] Care Management Coordinator [name] Direct Dial: [phone]  Fax: [phone] Website: [email or web address]

## 2023-05-23 ENCOUNTER — Ambulatory Visit (INDEPENDENT_AMBULATORY_CARE_PROVIDER_SITE_OTHER): Admitting: Internal Medicine

## 2023-05-23 ENCOUNTER — Encounter: Payer: Self-pay | Admitting: Internal Medicine

## 2023-05-23 DIAGNOSIS — I10 Essential (primary) hypertension: Secondary | ICD-10-CM

## 2023-05-23 DIAGNOSIS — E876 Hypokalemia: Secondary | ICD-10-CM

## 2023-05-23 DIAGNOSIS — E119 Type 2 diabetes mellitus without complications: Secondary | ICD-10-CM | POA: Insufficient documentation

## 2023-05-23 DIAGNOSIS — Z23 Encounter for immunization: Secondary | ICD-10-CM

## 2023-05-23 DIAGNOSIS — Z794 Long term (current) use of insulin: Secondary | ICD-10-CM | POA: Diagnosis not present

## 2023-05-23 DIAGNOSIS — Z1159 Encounter for screening for other viral diseases: Secondary | ICD-10-CM | POA: Insufficient documentation

## 2023-05-23 LAB — URINALYSIS, ROUTINE W REFLEX MICROSCOPIC
Ketones, ur: NEGATIVE
Leukocytes,Ua: NEGATIVE
Nitrite: NEGATIVE
Specific Gravity, Urine: 1.025 (ref 1.000–1.030)
Urine Glucose: 100 — AB
Urobilinogen, UA: 0.2 (ref 0.0–1.0)
pH: 6 (ref 5.0–8.0)

## 2023-05-23 LAB — MICROALBUMIN / CREATININE URINE RATIO
Creatinine,U: 234.4 mg/dL
Microalb Creat Ratio: 5.6 mg/g (ref 0.0–30.0)
Microalb, Ur: 1.3 mg/dL (ref 0.0–1.9)

## 2023-05-23 LAB — HEPATIC FUNCTION PANEL
ALT: 30 U/L (ref 0–35)
AST: 25 U/L (ref 0–37)
Albumin: 3.8 g/dL (ref 3.5–5.2)
Alkaline Phosphatase: 77 U/L (ref 39–117)
Bilirubin, Direct: 0.1 mg/dL (ref 0.0–0.3)
Total Bilirubin: 0.5 mg/dL (ref 0.2–1.2)
Total Protein: 7.4 g/dL (ref 6.0–8.3)

## 2023-05-23 LAB — BASIC METABOLIC PANEL WITH GFR
BUN: 10 mg/dL (ref 6–23)
CO2: 27 meq/L (ref 19–32)
Calcium: 9.2 mg/dL (ref 8.4–10.5)
Chloride: 105 meq/L (ref 96–112)
Creatinine, Ser: 0.72 mg/dL (ref 0.40–1.20)
GFR: 106.61 mL/min (ref >60.00)
Glucose, Bld: 245 mg/dL — ABNORMAL HIGH (ref 70–99)
Potassium: 4.1 meq/L (ref 3.5–5.1)
Sodium: 138 meq/L (ref 135–145)

## 2023-05-23 LAB — LIPID PANEL
Cholesterol: 191 mg/dL (ref 0–200)
HDL: 38.4 mg/dL — ABNORMAL LOW (ref >39.00)
LDL Cholesterol: 136 mg/dL — ABNORMAL HIGH (ref 0–99)
NonHDL: 153.07
Total CHOL/HDL Ratio: 5
Triglycerides: 87 mg/dL (ref 0.0–149.0)
VLDL: 17.4 mg/dL (ref 0.0–40.0)

## 2023-05-23 LAB — MAGNESIUM: Magnesium: 1.8 mg/dL (ref 1.5–2.5)

## 2023-05-23 LAB — TSH: TSH: 0.95 u[IU]/mL (ref 0.35–5.50)

## 2023-05-23 NOTE — Progress Notes (Unsigned)
 Subjective:  Patient ID: Miranda Stewart, female    DOB: [birth year]  Age: 38 y.o. MRN: [id number]  CC: Diabetes   HPI Miranda Stewart presents for f/up -----  Discussed the use of AI scribe software for clinical note transcription with the patient, who gave verbal consent to proceed.  History of Present Illness   Miranda Stewart is a 38 year old female with diabetes who presents for follow-up after hospitalization for diabetic ketoacidosis.  She feels better since her recent hospitalization for diabetic ketoacidosis. No current nausea, vomiting, abdominal pain, fever, or chills. Her high blood sugar 'crept up' on her, and she felt unwell for weeks prior to hospitalization but was unable to get an appointment sooner.  She continues to use insulin and a Libre device for glucose monitoring. Blood sugar levels have improved, now in the 200s, down from 634 at hospital admission. She also uses a finger-prick device for monitoring. She is unsure about the correct dosing of her insulin, specifically regarding timing and amount related to meals. No current symptoms of high or low blood sugar, such as dizziness, lightheadedness, excessive thirst, or excessive urination.  Her vision is improving, though it was affected during the episode of high blood sugar. Her last eye exam was one to two years ago.  She has an implant for contraception and has not had a menstrual cycle since its placement in 2020, eliminating the risk of pregnancy.  She has noticed weight gain since her last visit but has not been tracking her weight closely. She does not smoke or drink alcohol.       Outpatient Medications Prior to Visit  Medication Sig Dispense Refill   amLODipine (NORVASC) 5 MG tablet Take 1 tablet (5 mg total) by mouth daily. 30 tablet 0   Blood Glucose Monitoring Suppl (BLOOD GLUCOSE MONITOR SYSTEM) w/Device KIT 1 each by Does not apply route 3 (three) times daily. May dispense any manufacturer covered  by patient's insurance. 1 kit 0   Blood Pressure KIT 1 each by Does not apply route 2 (two) times daily. 1 kit 0   etonogestrel (NEXPLANON) 68 MG IMPL implant 1 each (68 mg total) by Subdermal route. 1 each 0   Glucose Blood (BLOOD GLUCOSE TEST STRIPS) STRP 1 each by Does not apply route 3 (three) times daily. Use as directed to check blood sugar. May dispense any manufacturer covered by patient's insurance and fits patient's device. 100 strip 0   insulin aspart (NOVOLOG) 100 UNIT/ML FlexPen Inject 5 Units into the skin 3 (three) times daily with meals. If eating and Blood Glucose (BG) 80 or higher inject 5 units for meal coverage and add correction dose per scale. If not eating, correction dose only. BG <150= 0 unit; BG 150-200= 1 unit; BG 201-250= 2 unit; BG 251-300= 3 unit; BG 301-350= 4 unit; BG 351-400= 5 unit; BG >400= 6 unit and Call Primary Care. 20 mL 0   insulin glargine (LANTUS) 100 UNIT/ML Solostar Pen Inject 35 Units into the skin daily. May substitute as needed per insurance. 20 mL 0   Insulin Pen Needle 31G X 8 MM MISC 1 each by Does not apply route 3 (three) times daily. May dispense any manufacturer covered by patient's insurance. 100 each 0   Lancet Device MISC Use 1 each 3 (three) times daily. 1 each 0   Lancets MISC 1 each by Does not apply route 3 (three) times daily. Use as directed to check blood sugar. May  dispense any manufacturer covered by patient's insurance and fits patient's device. 100 each 0   fluconazole (DIFLUCAN) 150 MG tablet Take 1 tablet (150 mg total) by mouth daily. (Patient not taking: Reported on 05/12/2023) 1 tablet 0   nystatin cream (MYCOSTATIN) Apply 1 Application topically 4 (four) times daily. Apply to affected area every 4-6 hours x 10 days (Patient not taking: Reported on 05/23/2023) 30 g 0   potassium chloride SA (KLOR-CON M) 20 MEQ tablet Take 2 tablets (40 mEq total) by mouth daily for 3 days. 6 tablet 0   No facility-administered medications prior to  visit.    ROS Review of Systems  Objective:  BP 130/74 (BP Location: Right Arm, Patient Position: Sitting, Cuff Size: Normal)   Pulse 80   Temp 98.4 F (36.9 C) (Oral)   Resp 16   Ht 5\' 8"  (1.727 m)   Wt (!) 343 lb (155.6 kg)   SpO2 98%   BMI 52.15 kg/m   BP Readings from Last 3 Encounters:  05/23/23 130/74  05/14/23 125/63  05/09/23 (!) 145/98    Wt Readings from Last 3 Encounters:  05/23/23 (!) 343 lb (155.6 kg)  05/12/23 (!) 328 lb (148.8 kg)  05/09/23 (!) 328 lb (148.8 kg)    Physical Exam  Lab Results  Component Value Date   WBC 9.1 05/13/2023   HGB 13.9 05/13/2023   HCT 39.4 05/13/2023   PLT 218 05/13/2023   GLUCOSE 245 (H) 05/23/2023   CHOL 191 05/23/2023   TRIG 87.0 05/23/2023   HDL 38.40 (L) 05/23/2023   LDLCALC 136 (H) 05/23/2023   ALT 30 05/23/2023   AST 25 05/23/2023   NA 138 05/23/2023   K 4.1 05/23/2023   CL 105 05/23/2023   CREATININE 0.72 05/23/2023   BUN 10 05/23/2023   CO2 27 05/23/2023   TSH 0.95 05/23/2023   HGBA1C 13.2 (H) 05/13/2023   MICROALBUR 1.3 05/23/2023    No results found.  Assessment & Plan:  Morbid obesity (HCC) -     Lipid panel; Future -     TSH; Future -     Hepatic function panel; Future  Insulin-requiring or dependent type II diabetes mellitus (HCC) -     Urinalysis, Routine w reflex microscopic; Future -     Hepatic function panel; Future -     Microalbumin / creatinine urine ratio; Future -     Basic metabolic panel with GFR; Future -     HM Diabetes Foot Exam -     Ambulatory referral to Endocrinology -     Ambulatory referral to Ophthalmology  Primary hypertension -     TSH; Future -     Urinalysis, Routine w reflex microscopic; Future -     Hepatic function panel; Future -     Basic metabolic panel with GFR; Future  Hypokalemia -     Magnesium; Future -     Basic metabolic panel with GFR; Future  Need for hepatitis C screening test -     Hepatitis C antibody; Future  Need for pneumococcal  20-valent conjugate vaccination -     Pneumococcal conjugate vaccine 20-valent  Flu vaccine need -     Flu vaccine trivalent PF, 6mos and older(Flulaval,Afluria,Fluarix,Fluzone)     Follow-up: Return in about 3 months (around 08/22/2023).  Sanda Linger, MD

## 2023-05-23 NOTE — Patient Instructions (Signed)
Type 1 Diabetes Mellitus, Diagnosis, Adult  Type 1 diabetes mellitus, or type 1 diabetes, is a long-term (chronic) disease. It happens when the cells in the pancreas that make a hormone called insulin are destroyed. Normally, insulin lets blood sugar (glucose) enter cells in your body. This gives you energy. If you have type 1 diabetes, glucose cannot get into cells. It builds up in the blood instead. This causes high blood glucose (hyperglycemia). There is no cure for type 1 diabetes. Treatment can help you manage your condition. What are the causes? The exact cause of type 1 diabetes is not known. What increases the risk? You may be more likely to have type 1 diabetes if a family member has it as well. You may also be more at risk if: You have a gene for type 1 diabetes that was passed down to you from a parent (inherited). You have an autoimmune disorder. This means that your body's disease-fighting system (immune system) attacks your body. You have been exposed to certain viruses. You live in an area with cold weather. What are the signs or symptoms? Symptoms may begin slowly over days or weeks. They may also start all of a sudden. Symptoms may include: Increased thirst or hunger. Needing to pee (urinate) more often or peeing more at night. Sudden weight changes that you cannot explain. Tiredness (fatigue) or weakness. Vision changes. These may include blurry vision. How is this diagnosed? Type 1 diabetes is diagnosed based on your symptoms, your medical history, a physical exam, and your blood glucose level. Your blood glucose may be checked with: A fasting blood glucose (FBG) test. You will not be allowed to eat (you will fast) for 8 hours or more before a blood sample is taken. A random blood glucose test. This test checks your blood glucose at any time of day no matter when you last ate. A hemoglobin A1C (A1C) blood test. This shows what your blood glucose levels have been over the  last 2-3 months. You may be diagnosed with type 1 diabetes if: Your FBG level is 126 mg/dL (7 mmol/L) or higher. Your random blood glucose level is 200 mg/dL (16.1 mmol/L) or higher. Your A1C level is 6.5% or higher. These blood tests may be done more than once. You may also need other blood tests. How is this treated? You may work with an expert called an endocrinologist to find ways to manage your diabetes. You should also follow instructions from your health care provider. You may need to: Take insulin every day. This helps to keep your blood glucose levels in the right range. Take medicines to help prevent problems from diabetes. You may need to take: Aspirin. Medicine to lower your cholesterol. Medicine to control your blood pressure. Check your blood glucose as often as told. Make diet and lifestyle changes. You may be told to: Follow a nutrition plan that has been made just for you by a dietitian. Get regular exercise. Find ways to manage stress. Your provider will set treatment goals that are right for you. These goals will be based on your age, other conditions you have, and how you respond to treatment. In general, your A1C level should be less than 7% and your blood glucose levels should be: 80-130 mg/dL (0.9-6.0 mmol/L) before meals (preprandial). Below 180 mg/dL (10 mmol/L) after meals (postprandial). Follow these instructions at home: Questions to ask your health care provider You may want to ask your provider: Do I need to meet with a certified  diabetes care and education specialist? Should I join a support group for people with diabetes? What equipment will I need to manage my diabetes at home? What diabetes medicines should I take, and when? How often should I check my blood glucose? What number should I call if I have questions? When is my next appointment? General instructions Take over-the-counter and prescription medicines only as told by your provider. Keep all  follow-up visits. You will need regular blood tests to make sure the treatments are working. Your provider may adjust your medicines based on your test results. Where to find more information American Diabetes Association (ADA): diabetes.org Association of Diabetes Care and Education Specialists (ADCES): diabeteseducator.org International Diabetes Federation (IDF): http://hill.biz/ Contact a health care provider if: Your blood glucose level is higher than 240 mg/dL (16.1 mmol/L) for 2 days in a row. You have been sick or have had a fever for 2 days or more, and you are not getting better. For more than 6 hours, you: Cannot eat or drink. Have nausea and vomiting. Have diarrhea. Get help right away if: Your blood glucose is less than 54 mg/dL (3 mmol/L). You become confused, or you have trouble thinking clearly. You have trouble breathing. These symptoms may be an emergency. Get help right away. Call 911. Do not wait to see if the symptoms will go away. Do not drive yourself to the hospital. This information is not intended to replace advice given to you by your health care provider. Make sure you discuss any questions you have with your health care provider. Document Revised: 10/12/2021 Document Reviewed: 10/12/2021 Elsevier Patient Education  2024 [employer].

## 2023-05-24 ENCOUNTER — Encounter: Payer: Self-pay | Admitting: Internal Medicine

## 2023-05-24 ENCOUNTER — Other Ambulatory Visit: Payer: Self-pay | Admitting: Internal Medicine

## 2023-05-24 DIAGNOSIS — E119 Type 2 diabetes mellitus without complications: Secondary | ICD-10-CM

## 2023-05-24 LAB — HEPATITIS C ANTIBODY: Hepatitis C Ab: NONREACTIVE

## 2023-05-24 MED ORDER — METFORMIN HCL ER 750 MG PO TB24: 1500.0000 mg | ORAL_TABLET | Freq: Every day | ORAL | 0 refills | Status: DC

## 2023-05-24 MED ORDER — TIRZEPATIDE 2.5 MG/0.5ML ~~LOC~~ SOAJ: 2.5000 mg | SUBCUTANEOUS | 0 refills | Status: DC

## 2023-05-24 NOTE — Telephone Encounter (Signed)
 Patient is requesting script for Miranda Stewart 3 as she received a letter stating it has been approved.  Routing to provider for review/authorization.    Copied from CRM [id number]. Topic: Clinical - Medication Refill >> May 24, 2023  3:12 PM Sim Boast F wrote: Most Recent Primary Care Visit:  Provider: [name]  Department: [facility]  Visit Type: OFFICE VISIT  Date: 06/01/2022  Medication: LIBRE 3 - Patient received letter in the mail stating that this was approved  Has the patient contacted their pharmacy? Yes (Agent: If no, request that the patient contact the pharmacy for the refill. If patient does not wish to contact the pharmacy document the reason why and proceed with request.) (Agent: If yes, when and what did the pharmacy advise?)  Is this the correct pharmacy for this prescription? Yes If no, delete pharmacy and type the correct one.  This is the patient's preferred pharmacy:   [id number] - [place],  - [address] AT [facility] [address] [place] [address] Phone: [phone] Fax: [phone]   Has the prescription been filled recently? No  Is the patient out of the medication? Yes  Has the patient been seen for an appointment in the last year OR does the patient have an upcoming appointment? Yes  Can we respond through MyChart? Yes  Agent: Please be advised that Rx refills may take up to 3 business days. We ask that you follow-up with your pharmacy.

## 2023-05-25 ENCOUNTER — Other Ambulatory Visit: Payer: Self-pay | Admitting: Internal Medicine

## 2023-05-25 ENCOUNTER — Other Ambulatory Visit: Payer: Self-pay | Admitting: *Deleted

## 2023-05-25 DIAGNOSIS — Z794 Long term (current) use of insulin: Secondary | ICD-10-CM

## 2023-05-25 MED ORDER — FREESTYLE LIBRE 3 SENSOR MISC: 1.0000 | Freq: Every day | 5 refills | Status: AC

## 2023-05-25 MED ORDER — FREESTYLE LIBRE 3 READER DEVI: 1.0000 | Freq: Every day | 3 refills | Status: AC

## 2023-05-25 NOTE — Patient Outreach (Signed)
 Care Management  Transitions of Care Program Transitions of Care Post-discharge week 2/ day # 9   05/25/2023 Name: Miranda Stewart MRN: [id number] DOB: Jan 31, 1986  Subjective: Miranda Stewart is a 38 y.o. year old female who is a primary care patient of [name], MD. The Care Management team Engaged with patient by telephone to assess and address transitions of care needs.   Consent to Services:  Patient was given information about care management services, agreed to services, and gave verbal consent to participate.  Enrolled into TOC 30-day program:  05/16/23  Assessment: "I am doing better; using the CGM like they told me to, but I need a new prescription sent in for a refill.  My blood sugars have started coming down with the insulin and I am making changes to my diet and seeing a big improvement already.  I am definitely planning on attending the DEC appointments"  Denies clinical concerns and sounds to be in no distress throughout [facility] 30-day program outreach call today          SDOH Interventions    Flowsheet Row Telephone from 05/16/2023 in [place] POPULATION HEALTH DEPARTMENT  SDOH Interventions   Food Insecurity Interventions Intervention Not Indicated  Housing Interventions Intervention Not Indicated  Transportation Interventions Intervention Not Indicated  Utilities Interventions Intervention Not Indicated        Goals Addressed             This Visit's Progress    TOC care plan   On track    Current Barriers:  Knowledge Deficits related to plan of care for management of DMII  Independent in self-care; lives with significant other and (2) twin boys- 38 y/o  RNCM Clinical Goal(s):  Patient will work with the Care Management team over the next 30 days to address Transition of Care Barriers: Medication Management take all medications exactly as prescribed and will call provider for medication related questions as evidenced by EMR attend all scheduled  medical appointments: PCP and Specialist  as evidenced by Electronic Medical Record  through collaboration with RN Care manager, provider, and care team.   Interventions: Evaluation of current treatment plan related to  self management and patient's adherence to plan as established by provider  Transitions of Care:  New goal. 05/25/23 Labs Reviewed all post-hospital discharge lab values with patient, as per PCP hospital follow up office visit on 05/23/23 Durable Medical Equipment (DME) needs assessed with patient/caregiver Doctor Visits  - discussed the importance of doctor visits Communication with PCP re: enrollment in [facility] 30-day program/ need for new Rx to be sent in to outpatient pharmacy for FSL-3, post hospital discharge  Diabetes Interventions:  (Status:  Goal on track:  Yes.) Short Term Goal 05/25/23 Assessed patient's understanding of A1c goal: <8% Discussed plans with patient for ongoing care management follow up and provided patient with direct contact information for care management team Reviewed scheduled/upcoming provider appointments including: DEC center 06/09/23 Advised patient, providing education and rationale, to check cbg q am fasting ans TID and record, calling PCP for findings outside established parameters Reviewed PCP hospital follow up visit 05/23/23 along with lab values, post- visit recommendations Patient confirms she plans to call outpatient pharmacy today to determine status of newly prescribed metformin and mounjaro: states she is comfortable messaging PCP via MyChart with any updates/ needs after calling outpatient pharmacy- she "suspects" insurance may need prior authorization for these medications Provided education/ reinforcement around significance of A1-C values/ trends:  she verbalizes good understanding of significance of A1-C and general A1-C goals Confirmed patient continues monitoring/ recording blood sugars at home-- uses CGM- FSL-3: verbalizes good baseline  understanding of importance of ongoing blood sugar monitoring at home Confirms has prior authorization from insurance provider: currently using FSL-3 from hospital discharge-- needs new Rx called in to outpatient pharmacy: requested same from PCP Confirmed patient also has blood sugar meter for use at home and understands how to use both FSL and blood sugar meter Provided basic/ initial education around basic pathophysiology of DM; long term complications of high blood sugar levels over time; simple strategies to decrease carbohydrate and sugar intake/ eliminate intake of sodas Reviewed recent blood sugars at home: patient reports today, "I am seeing 200-260's in the morning for fasting; and then, later in day, I am seeing 160-170's consistently" Provided education regarding difference between long- and short- acting insulins-- patient new to insulin  Provided education re: signs/ symptoms low blood sugar with corresponding action plan- today, she denies signs/ symptoms/ home values that indicate hypoglycemia and verbalizes good baseline understanding of same Provided education regarding difference between long- and short- acting insulins-- patient new to insulin: including need to eat immediately when taking; signs/ symptoms hypoglycemia along with corresponding action plan  Lab Results  Component Value Date   HGBA1C 13.2 (H) 05/13/2023   Patient Goals/Self-Care Activities: Participate in Transition of Care Program/Attend [facility] scheduled calls Take all medications as prescribed Attend all scheduled provider appointments Call pharmacy for medication refills 3-7 days in advance of running out of medications Call provider office for new concerns or questions  check blood sugar at prescribed times: before meals and at bedtime, 4 times daily, and when you have symptoms of low or high blood sugar enter blood sugar readings and medication or insulin into daily log take the blood sugar log to all doctor  visits Follow up with  Diabetes nutrition and education classes- scheduled appointment on 06/09/23 at 2: 15 pm  Follow Up Plan:  Telephone follow up appointment with care management team member scheduled for:  06/02/23 11:00         Plan: Telephone follow up appointment with care management team member scheduled for:   06/02/23 11:00  Total time spent from review to signing of note/ including any care coordination interventions: 73 minutes: initial review for this RN CM; initiation of TOC 30-day initial assessment; expansion of complex care plan  Pls call/ message for questions,  Caryl Pina, RN, BSN, Media planner  Transitions of Care  VBCI - [facility] [phone]: direct office

## 2023-05-25 NOTE — Patient Instructions (Signed)
 Visit Information  Thank you for taking time to visit with me today. Please don't hesitate to contact me if I can be of assistance to you before our next scheduled telephone appointment.  Our next appointment is by telephone on Thursday 06/02/23 at 11:00 am  Please call the care guide team at [phone] if you need to cancel or reschedule your appointment.   Following are the goals we discussed today:  Patient Goals/Self-Care Activities: Participate in Transition of Care Program/Attend [facility] scheduled calls Take all medications as prescribed Attend all scheduled provider appointments Call pharmacy for medication refills 3-7 days in advance of running out of medications Call provider office for new concerns or questions  check blood sugar at prescribed times: before meals and at bedtime, 4 times daily, and when you have symptoms of low or high blood sugar enter blood sugar readings and medication or insulin into daily log take the blood sugar log to all doctor visits Follow up with Diabetes nutrition and education classes- scheduled appointment on 06/09/23 at 2: 15 pm  If you are experiencing a Mental Health or Behavioral Health Crisis or need someone to talk to, please  call the Suicide and Crisis Lifeline: 988 call the [place] National Suicide Prevention Lifeline: 3038260274 or TTY: 8583053437 TTY 279 809 4616) to talk to a trained counselor call 1-800-273-TALK (toll free, 24 hour hotline) go to [facility] [address], [place] [phone]) call the [facility] Crisis Line: [phone] call 911   Patient verbalizes understanding of instructions and care plan provided today and agrees to view in MyChart. Active MyChart status and patient understanding of how to access instructions and care plan via MyChart confirmed with patient.     Caryl Pina, RN, BSN, Media planner  Transitions of Care  VBCI -  [facility] [phone]: direct office

## 2023-05-30 ENCOUNTER — Inpatient Hospital Stay: Admitting: Internal Medicine

## 2023-05-31 ENCOUNTER — Encounter: Admitting: Internal Medicine

## 2023-06-02 ENCOUNTER — Other Ambulatory Visit: Payer: Self-pay | Admitting: *Deleted

## 2023-06-02 NOTE — Transitions of Care (Post Inpatient/ED Visit) (Signed)
 Care Management  Transitions of Care Program Transitions of Care Post-discharge week 3  06/02/2023 Name: Miranda Stewart MRN: [id number] DOB: [birth year]  Subjective: Miranda Stewart is a 38 y.o. year old female who is a primary care patient of [name], MD. The Care Management team was unable to reach the patient by phone to assess and address transitions of care needs.   Left HIPAA compliant voice message requesting call back   Plan: Additional outreach attempts will be made to reach the patient enrolled in the [facility] Program (Post Inpatient/ED Visit).  Pls call/ message for questions,  Caryl Pina, RN, BSN, CCRN Alumnus RN Care Manager  Transitions of Care  VBCI - [facility] [phone]: direct office

## 2023-06-03 ENCOUNTER — Telehealth: Payer: Self-pay | Admitting: *Deleted

## 2023-06-03 NOTE — Transitions of Care (Post Inpatient/ED Visit) (Signed)
 Care Management  Transitions of Care Program Transitions of Care Post-discharge week 3/ day # 18- attempt # 2- unsuccessful  06/03/2023 Name: CONSTANCE WHITTLE MRN: [id number] DOB: 10/24/1985  Subjective: SHELISHA GAUTIER is a 38 y.o. year old female who is a primary care patient of [name], MD. The Care Management team was unable to reach the patient by phone to assess and address transitions of care needs.   Plan: Additional outreach attempts will be made to reach the patient enrolled in the [facility] Program (Post Inpatient Visit).  Left HIPAA compliant voice message requesting call back  Pls call/ message for questions,  Caryl Pina, RN, BSN, CCRN Alumnus RN Care Manager  Transitions of Care  VBCI - [facility] [phone]: direct office

## 2023-06-06 ENCOUNTER — Other Ambulatory Visit: Payer: Self-pay | Admitting: Internal Medicine

## 2023-06-06 ENCOUNTER — Telehealth: Payer: Self-pay | Admitting: *Deleted

## 2023-06-06 ENCOUNTER — Telehealth: Payer: Self-pay | Admitting: Internal Medicine

## 2023-06-06 NOTE — Telephone Encounter (Signed)
 Copied from CRM [id number]. Topic: Referral - Question >> Jun 06, 2023  1:53 PM Zipporah Him wrote: Reason for CRM: Patient just spoke with the office for her endocrinology referral and they do not accept Medicaid.

## 2023-06-06 NOTE — Transitions of Care (Post Inpatient/ED Visit) (Signed)
 Transition of Care week 4/ day # 21- case closure per patient request  Visit Note  06/06/2023  Name: Miranda Stewart MRN: [id number]          DOB: [birth year]  Situation: Patient enrolled in [facility] 30-day program. Visit completed with patient by telephone.   HIPAA identifiers x 2 verified  Background:  Recent hospitalization March 20-22, 2025 for DKA with Hgb A1-C 13.0  Initial Transition Care Management Follow-up Telephone Call    Past Medical History:  Diagnosis Date   Anxiety    Benign essential HTN    Depression    Dermoid cyst    per pt, left ovary   Family history of cardiomyopathy 05/30/2018   Migraines    Obesity    Polycystic ovarian disease    Postpartum state    Seasonal allergies    Vaginal Pap smear, abnormal     Assessment: Patient Reported Symptoms: Cognitive Cognitive Status: Alert and oriented to person, place, and time, Insightful and able to interpret abstract concepts, Normal speech and language skills Cognitive/Intellectual Conditions Management [RPT]: None reported or documented in medical history or problem list      Neurological      HEENT HEENT Symptoms Reported: No symptoms reported, Not assessed (not indicated)      Cardiovascular Cardiovascular Symptoms Reported: No symptoms reported, Not assessed (not indicated)    Respiratory Respiratory Symptoms Reported: No symptoms reported, Not assesed (not indicated)    Endocrine Patient reports the following symptoms related to hypoglycemia or hyperglycemia : No symptoms reported Is patient diabetic?: Yes Is patient checking blood sugars at home?: Yes Endocrine Conditions: Diabetes Endocrine Management Strategies: Medication therapy, Medical device, Routine screening, Diet modification, Coping strategies, Adequate rest, Activity  Gastrointestinal Gastrointestinal Symptoms Reported: No symptoms reported Additional Gastrointestinal Details: Reports "going to the bathroom to poop normally; poop is  normal" Gastrointestinal Management Strategies: Diet modification    Genitourinary      Integumentary Integumentary Symptoms Reported: No symptoms reported, Not assessed (not indicated)    Musculoskeletal Musculoskelatal Symptoms Reviewed: No symptoms reported, Not assessed (not indicated)        Psychosocial Psychosocial Symptoms Reported: No symptoms reported, Not assessed (not indicated)         There were no vitals filed for this visit.  Medications Reviewed Today     Reviewed by Michaela Corner, RN (Registered Nurse) on 06/06/23 at 1618  Med List Status: <None>   Medication Order Taking? Sig Documenting Provider Last Dose Status Informant  amLODipine (NORVASC) 5 MG tablet [id number]  TAKE 1 TABLET BY MOUTH EVERY DAY [name], MD  Active   Blood Glucose Monitoring Suppl (BLOOD GLUCOSE MONITOR SYSTEM) w/Device KIT [id number]  1 each by Does not apply route 3 (three) times daily. May dispense any manufacturer covered by patient's insurance. Burnadette Pop, MD  Active   Blood Pressure KIT [id number]  1 each by Does not apply route 2 (two) times daily. Burnadette Pop, MD  Active   Continuous Glucose Receiver (FREESTYLE LIBRE 3 READER) DEVI [id number]  1 Act by Does not apply route daily. [name], MD  Active   Continuous Glucose Sensor (FREESTYLE LIBRE 3 SENSOR) [place] [id number]  1 Act by Does not apply route daily. Place 1 sensor on the skin every 14 days. Use to check glucose continuously [name], MD  Active   etonogestrel (NEXPLANON) 68 MG IMPL implant [id number]  1 each (68 mg total) by Subdermal route. [name], MD  Active Self, Pharmacy Records           Med Note Cornelia Dieter, SEBASTIAN   Thu May 12, 2023  8:05 PM) In place  fluconazole (DIFLUCAN) 150 MG tablet [id number]  Take 1 tablet (150 mg total) by mouth daily.  Patient not taking: Reported on 05/12/2023   Debbra Fairy, PA-C  Active Self, Pharmacy Records           Med Note [facility], SEBASTIAN   Thu May 12, 2023  8:05 PM) Therapy completed on 05/09/23, single dose course  Glucose Blood (BLOOD GLUCOSE TEST STRIPS) STRP [id number]  1 each by Does not apply route 3 (three) times daily. Use as directed to check blood sugar. May dispense any manufacturer covered by patient's insurance and fits patient's device. Leona Rake, MD  Active   insulin aspart (NOVOLOG) 100 UNIT/ML FlexPen [id number] Yes Inject 5 Units into the skin 3 (three) times daily with meals. If eating and Blood Glucose (BG) 80 or higher inject 5 units for meal coverage and add correction dose per scale. If not eating, correction dose only. BG <150= 0 unit; BG 150-200= 1 unit; BG 201-250= 2 unit; BG 251-300= 3 unit; BG 301-350= 4 unit; BG 351-400= 5 unit; BG >400= 6 unit and Call Primary Care. Leona Rake, MD Taking Active   insulin glargine (LANTUS) 100 UNIT/ML Solostar Pen [id number] Yes Inject 35 Units into the skin daily. May substitute as needed per insurance. Leona Rake, MD Taking Active   Insulin Pen Needle 31G X 8 MM MISC [id number]  1 each by Does not apply route 3 (three) times daily. May dispense any manufacturer covered by patient's insurance. Leona Rake, MD  Active   Lancet Device MISC [id number]  Use 1 each 3 (three) times daily. Leona Rake, MD  Active   Lancets MISC [id number]  1 each by Does not apply route 3 (three) times daily. Use as directed to check blood sugar. May dispense any manufacturer covered by patient's insurance and fits patient's device. Leona Rake, MD  Active   metFORMIN (GLUCOPHAGE-XR) 750 MG 24 hr tablet [id number] Yes Take 2 tablets (1,500 mg total) by mouth daily with breakfast. Arcadio Knuckles, MD Taking Active            Med Note (Doneta Bayman M   Wed May 25, 2023 11:34 AM) 05/25/23: Reports during TOC call, has not yet received from outpatient pharmacy- she plans to call today to see if this needs prior authorization- states she will let dr. Rochelle Chu know, if so   nystatin cream (MYCOSTATIN)  [id number]  Apply 1 Application topically 4 (four) times daily. Apply to affected area every 4-6 hours x 10 days  Patient not taking: Reported on 05/23/2023   Debbra Fairy, PA-C  Active Self, Pharmacy Records  potassium chloride SA (KLOR-CON M) 20 MEQ tablet [id number]  Take 2 tablets (40 mEq total) by mouth daily for 3 days. Leona Rake, MD  Expired 05/18/23 2359   tirzepatide (MOUNJARO) 2.5 MG/0.5ML Pen [id number] No Inject 2.5 mg into the skin once a week.  Patient not taking: Reported on 06/06/2023   Arcadio Knuckles, MD Not Taking Active            Med Note (Riya Huxford M   Mon Jun 06, 2023  4:18 PM) 06/06/23- Reports during [facility] call today, she continues awaiting prior authorization from her insurance company            Recommendation:   Continue current/ established plan  of care for DM  Follow Up Plan:   Patient requests case closure of TOC 30-day program; case closed accordingly  Total time spent from review to signing of note/ including any care coordination interventions:  43 minutes  Pls call/ message for questions,  Oneika Simonian Mckinney Maite Burlison, RN, BSN, CCRN Alumnus RN Care Manager  Transitions of Care  VBCI - [facility] [phone]: direct office

## 2023-06-07 NOTE — Telephone Encounter (Signed)
 Can you send her referral to another office that accepts medicaid?

## 2023-06-08 ENCOUNTER — Telehealth: Payer: Self-pay

## 2023-06-08 ENCOUNTER — Other Ambulatory Visit (HOSPITAL_COMMUNITY): Payer: Self-pay

## 2023-06-08 NOTE — Telephone Encounter (Signed)
 Pharmacy Patient Advocate Encounter   Received notification from RX Request Messages that prior authorization for Mounjaro 2.5MG /0.5ML auto-injectors is required/requested.   Insurance verification completed.   The patient is insured through [facility] MEDICAID .   Per test claim: PA required; PA submitted to above mentioned insurance via CoverMyMeds Key/confirmation #/EOC [id number] Status is pending

## 2023-06-08 NOTE — Telephone Encounter (Signed)
 Pharmacy Patient Advocate Encounter  Received notification from [facility] MEDICAID that Prior Authorization for Mounjaro 2.5MG /0.5ML auto-injectors has been DENIED.  See denial reason below. No denial letter attached in CMM. Will attach denial letter to Media tab once received.   PA #/Case ID/Reference #: [id number]

## 2023-06-08 NOTE — Telephone Encounter (Signed)
 Patient has been made aware.

## 2023-06-09 ENCOUNTER — Encounter: Admitting: Internal Medicine

## 2023-06-09 ENCOUNTER — Ambulatory Visit: Admitting: Dietician

## 2023-06-13 ENCOUNTER — Other Ambulatory Visit: Payer: Self-pay | Admitting: Internal Medicine

## 2023-06-13 ENCOUNTER — Encounter: Payer: Self-pay | Admitting: Internal Medicine

## 2023-06-13 NOTE — Telephone Encounter (Signed)
 Copied from CRM [id number]. Topic: Referral - Question >> Jun 13, 2023  8:21 AM Adonis Hoot wrote: Reason for CRM: Patient called in regarding referral that was sent to endocrinologist.She stated that [facility] do not take her insurance and would like to know if referral could be resent to a location that's in network with her insurance.

## 2023-06-14 ENCOUNTER — Ambulatory Visit: Payer: Self-pay

## 2023-06-14 ENCOUNTER — Other Ambulatory Visit: Payer: Self-pay | Admitting: Internal Medicine

## 2023-06-14 ENCOUNTER — Inpatient Hospital Stay: Admitting: Internal Medicine

## 2023-06-14 ENCOUNTER — Telehealth: Payer: Self-pay

## 2023-06-14 ENCOUNTER — Other Ambulatory Visit (HOSPITAL_COMMUNITY): Payer: Self-pay

## 2023-06-14 DIAGNOSIS — E119 Type 2 diabetes mellitus without complications: Secondary | ICD-10-CM

## 2023-06-14 MED ORDER — TRULICITY 0.75 MG/0.5ML ~~LOC~~ SOAJ: 0.7500 mg | SUBCUTANEOUS | 0 refills | Status: DC

## 2023-06-14 NOTE — Telephone Encounter (Signed)
 Patient wants to know if she should continue taking the latus and novolog  also?

## 2023-06-14 NOTE — Telephone Encounter (Signed)
 Please advise. I have sent you mychart messages on this also.

## 2023-06-14 NOTE — Telephone Encounter (Signed)
 Patient has been made aware of Sr. Rochelle Chu comments. She gave a verbal understanding.

## 2023-06-14 NOTE — Telephone Encounter (Signed)
 Pharmacy Patient Advocate Encounter  Received notification from [employer] that Prior Authorization for Trulicity 0.75MG /0.5ML auto-injectors  has been APPROVED from 06/14/23 to 06/13/24. Ran test claim, Copay is $4. This test claim was processed through [facility]- copay amounts may vary at other pharmacies due to pharmacy/plan contracts, or as the patient moves through the different stages of their insurance plan.   PA #/Case ID/Reference #: [id number]

## 2023-06-14 NOTE — Telephone Encounter (Signed)
 Pharmacy Patient Advocate Encounter   Received notification from Onbase that prior authorization for Trulicity 0.75MG /0.5ML auto-injectors is required/requested.   Insurance verification completed.   The patient is insured through [facility] .   Per test claim: PA required; PA submitted to above mentioned insurance via CoverMyMeds Key/confirmation #/EOC [id number] Status is pending

## 2023-06-14 NOTE — Telephone Encounter (Signed)
 Copied from CRM [id number]. Topic: Referral - Prior Authorization Question >> Jun 14, 2023  3:04 PM [name] wrote: Reason for CRM: Jhana from [employer] for a prior authorization for Dulaglutide (TRULICITY) 0.75 MG/0.5ML SOAJ. She also provided the phone number: [phone]. Requesting this authorization to be sent over.

## 2023-06-14 NOTE — Telephone Encounter (Signed)
 Patient has been taking Lantus  and Novolog  since being discharged from the hospital. Per patient and chart, patient was prescribed/instructed to start taking Metformin  and Mounjaro at [facility]. Patient's prior authorization for Mounjaro was denied. Patient would like further clarification on what medications she is supposed to be taking since the Mounjaro was denied. Patient also requesting referral for endocrinologist. Patient stated she has been attempting to contact office for 1-2 weeks about this and has not heard back. Please advise. Patient requesting a call  back today.   Copied from CRM [id number]. Topic: Clinical - Prescription Issue >> Jun 14, 2023  8:40 AM Ovid Blow wrote: Reason for CRM: Patient stated she wanted to speak with a nurse concerning her medication Mounjaro and metformin . Patient needs to know if she needs to start taking the metformin . Haven't received Mounjaro because it was denied. No one has reached out to patient concerning these issues Reason for Disposition . [1] Caller requesting NON-URGENT health information AND [2] PCP's office is the best resource  Protocols used: Information Only Call - No Triage-A-AH

## 2023-06-15 NOTE — Telephone Encounter (Signed)
 Patient called back to follow-up on the Rx requester for her lancets that was sent on 4/21. I informed her that request is still pending and to allow 3 business days. She asked for a message to be sent back.

## 2023-06-16 NOTE — Telephone Encounter (Signed)
 Patient has been made aware.

## 2023-06-20 ENCOUNTER — Other Ambulatory Visit: Payer: Self-pay | Admitting: Internal Medicine

## 2023-06-24 NOTE — Telephone Encounter (Signed)
 Copied from CRM [id number]. Topic: Clinical - Medication Question >> Jun 24, 2023  8:49 AM Miranda Stewart wrote: Reason for CRM: Patient called about her medication she stated she will need for over the weekend

## 2023-06-27 ENCOUNTER — Other Ambulatory Visit: Payer: Self-pay

## 2023-06-27 MED ORDER — INSULIN ASPART 100 UNIT/ML FLEXPEN: 5.0000 [IU] | PEN_INJECTOR | Freq: Three times a day (TID) | SUBCUTANEOUS | 0 refills | Status: DC

## 2023-06-27 NOTE — Progress Notes (Signed)
 Diabetes Self-Management Education  Visit Type: First/Initial  Appt. Start Time: 1015 Appt. End Time: 1119  07/01/2023  [name], identified by name and date of birth, is a 38 y.o. female with a diagnosis of Diabetes: Type 2.   ASSESSMENT   Patient is here today alone. Pt reports she is connected to therapy for depression.  Patient would like to learn more about diabetes. Patient lives with family.  Pt reports she does the shopping and cooking. 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   Pt reports she has made the following changes including increasing water, omitting juice and selecting brown grains  and increasing non starchy vegetables. Pt reports she is currently enjoying gardening daily and just dancing two day weekly.  Novol breakft 5 Lunch and dinner 10   67 cgm, finger prick 71  There were no vitals taken for this visit. There is no height or weight on file to calculate BMI.   Diabetes Self-Management Education - 07/01/23 1040       Visit Information   Visit Type First/Initial      Initial Visit   Diabetes Type Type 2    Date Diagnosed 2025    Are you currently following a meal plan? No    Are you taking your medications as prescribed? Yes      Psychosocial Assessment   Patient Belief/Attitude about Diabetes Motivated to manage diabetes    What is the hardest part about your diabetes right now, causing you the most concern, or is the most worrisome to you about your diabetes?   Making healty food and beverage choices    Self-care barriers None    Self-management support Doctor's office    Other persons present Patient    Patient Concerns Nutrition/Meal planning    Special Needs None    Preferred Learning Style Hands on;Visual;Auditory    How often do you need to have someone help you when you read instructions, pamphlets, or other written materials from your doctor or pharmacy? 1 - Never    What is the last grade level you completed in school? 12th       Pre-Education Assessment   Patient understands the diabetes disease and treatment process. Needs Instruction    Patient understands incorporating nutritional management into lifestyle. Needs Instruction    Patient undertands incorporating physical activity into lifestyle. Needs Instruction    Patient understands using medications safely. Needs Instruction    Patient understands monitoring blood glucose, interpreting and using results Needs Instruction    Patient understands prevention, detection, and treatment of acute complications. Needs Instruction    Patient understands prevention, detection, and treatment of chronic complications. Needs Instruction    Patient understands how to develop strategies to address psychosocial issues. Needs Instruction    Patient understands how to develop strategies to promote health/change behavior. Needs Instruction      Complications   Last HgB A1C per patient/outside source 13.2 %    How often do you check your blood sugar? > 4 times/day    Fasting Blood glucose range (mg/dL) 30-865    Postprandial Blood glucose range (mg/dL) 78-469    Number of hypoglycemic episodes per month 1    Can you tell when your blood sugar is low? No    Number of hyperglycemic episodes ( >200mg /dL): Never    Can you tell when your blood sugar is high? Yes    What do you do if your blood sugar is high? tired,  frequent urination, thirsty    Have you had a dilated eye exam in the past 12 months? No    Have you had a dental exam in the past 12 months? No    Are you checking your feet? No   Pt instructed to perfrom daily foot checks     Dietary Intake   Breakfast peanut butter and jelly on 2 slices of honey wheat bread, water or protein powder mixed with  6 ounces of 2% and coffee, flavored creamer    Lunch [place] and cheese, 2 slices of honey wheat, lettuce tomato, onion, SF giner ale    Dinner 3 flour tortilla, ground [place], cheese, lettuce, tomato, sparkling water     Beverage(s) water, sparkling water,      Activity / Exercise   Activity / Exercise Type ADL's      Patient Education   Previous Diabetes Education No    Disease Pathophysiology Definition of diabetes, type 1 and 2, and the diagnosis of diabetes    Healthy Eating Plate Method;Role of diet in the treatment of diabetes and the relationship between the three main macronutrients and blood glucose level;Reviewed blood glucose goals for pre and post meals and how to evaluate the patients' food intake on their blood glucose level.;Meal timing in regards to the patients' current diabetes medication.;Food label reading, portion sizes and measuring food.    Being Active Role of exercise on diabetes management, blood pressure control and cardiac health.    Medications Reviewed patients medication for diabetes, action, purpose, timing of dose and side effects.    Monitoring Daily foot exams;Yearly dilated eye exam;Identified appropriate SMBG and/or A1C goals.;Taught/evaluated CGM (comment)    Acute complications Taught prevention, symptoms, and  treatment of hypoglycemia - the 15 rule.    Chronic complications Dental care;Retinopathy and reason for yearly dilated eye exams;Relationship between chronic complications and blood glucose control;Assessed and discussed foot care and prevention of foot problems    Diabetes Stress and Support Identified and addressed patients feelings and concerns about diabetes    Preconception care Role of family planning for patients with diabetes    Lifestyle and Health Coping Lifestyle issues that need to be addressed for better diabetes care      Individualized Goals (developed by patient)   Nutrition Follow meal plan discussed    Physical Activity 60 minutes per day;Exercise 3-5 times per week    Medications take my medication as prescribed    Monitoring  Consistenly use CGM    Problem Solving Eating Pattern    Reducing Risk examine blood glucose patterns;treat  hypoglycemia with 15 grams of carbs if blood glucose less than 70mg /dL;do foot checks daily    Health Coping Ask for help with psychological, social, or emotional issues      Post-Education Assessment   Patient understands the diabetes disease and treatment process. Comprehends key points    Patient understands incorporating nutritional management into lifestyle. Comprehends key points    Patient undertands incorporating physical activity into lifestyle. Comprehends key points    Patient understands using medications safely. Comphrehends key points    Patient understands monitoring blood glucose, interpreting and using results Needs Review    Patient understands prevention, detection, and treatment of acute complications. Comprehends key points    Patient understands prevention, detection, and treatment of chronic complications. Needs Review    Patient understands how to develop strategies to address psychosocial issues. Needs Review    Patient understands how to develop strategies to promote health/change  behavior. Needs Review      Outcomes   Expected Outcomes Demonstrated interest in learning. Expect positive outcomes    Future DMSE 3-4 months    Program Status Not Completed             Individualized Plan for Diabetes Self-Management Training:   Learning Objective:  Patient will have a greater understanding of diabetes self-management. Patient education plan is to attend individual and/or group sessions per assessed needs and concerns.   Plan:   Patient Instructions  Increase physical activity of just dance for 60 minutes on 3 days weekly  Expected Outcomes:  Demonstrated interest in learning. Expect positive outcomes  Education material provided: ADA - How to Thrive: A Guide for Your Journey with Diabetes, My Plate, Snack sheet, Support group flyer, and Diabetes Resources  If problems or questions, patient to contact team via:  Phone  Future DSME appointment: 3-4  months

## 2023-07-01 ENCOUNTER — Encounter: Attending: Internal Medicine | Admitting: Dietician

## 2023-07-01 ENCOUNTER — Other Ambulatory Visit: Payer: Self-pay | Admitting: Internal Medicine

## 2023-07-01 DIAGNOSIS — F32A Depression, unspecified: Secondary | ICD-10-CM | POA: Insufficient documentation

## 2023-07-01 DIAGNOSIS — E119 Type 2 diabetes mellitus without complications: Secondary | ICD-10-CM | POA: Diagnosis not present

## 2023-07-01 DIAGNOSIS — E0810 Diabetes mellitus due to underlying condition with ketoacidosis without coma: Secondary | ICD-10-CM | POA: Insufficient documentation

## 2023-07-01 NOTE — Patient Instructions (Signed)
 Increase physical activity of just dance for 60 minutes on 3 days weekly

## 2023-07-10 ENCOUNTER — Other Ambulatory Visit: Payer: Self-pay | Admitting: Internal Medicine

## 2023-07-10 DIAGNOSIS — E119 Type 2 diabetes mellitus without complications: Secondary | ICD-10-CM

## 2023-07-14 ENCOUNTER — Encounter: Payer: Self-pay | Admitting: Internal Medicine

## 2023-08-04 ENCOUNTER — Other Ambulatory Visit: Payer: Self-pay | Admitting: Internal Medicine

## 2023-08-04 DIAGNOSIS — E119 Type 2 diabetes mellitus without complications: Secondary | ICD-10-CM

## 2023-08-08 ENCOUNTER — Encounter: Payer: Self-pay | Admitting: Internal Medicine

## 2023-08-12 NOTE — Progress Notes (Deleted)
   Diabetes Self-Management Education  Visit Type:    Appt. Start Time: *** Appt. End Time: ***  08/12/2023  [name], identified by name and date of birth, is a 38 y.o. female with a diagnosis of Diabetes:  .   ASSESSMENT  5/9:  Patient is here today alone. Pt reports she is connected to therapy for depression.  Patient would like to learn more about diabetes. Patient lives with family.  Pt reports she does the shopping and cooking. 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   Pt reports she has made the following changes including increasing water, omitting juice and selecting brown grains  and increasing non starchy vegetables. Pt reports she is currently enjoying gardening daily and just dancing two day weekly.  Novolog  breakfast 5 Lunch and dinner 10   There were no vitals taken for this visit. There is no height or weight on file to calculate BMI.     Individualized Plan for Diabetes Self-Management Training:   Learning Objective:  Patient will have a greater understanding of diabetes self-management. Patient education plan is to attend individual and/or group sessions per assessed needs and concerns.   Plan:   There are no Patient Instructions on file for this visit.  Expected Outcomes:     Education material provided: ADA - How to Thrive: A Guide for Your Journey with Diabetes, My Plate, Snack sheet, Support group flyer, and Diabetes Resources  If problems or questions, patient to contact team via:  Phone  Future DSME appointment:

## 2023-08-19 ENCOUNTER — Other Ambulatory Visit: Payer: Self-pay | Admitting: Internal Medicine

## 2023-08-19 ENCOUNTER — Ambulatory Visit: Admitting: Dietician

## 2023-08-19 DIAGNOSIS — E0810 Diabetes mellitus due to underlying condition with ketoacidosis without coma: Secondary | ICD-10-CM

## 2023-08-23 ENCOUNTER — Ambulatory Visit: Admitting: Internal Medicine

## 2023-08-30 ENCOUNTER — Ambulatory Visit: Admitting: Internal Medicine

## 2023-09-05 ENCOUNTER — Other Ambulatory Visit: Payer: Self-pay | Admitting: Internal Medicine

## 2023-09-05 DIAGNOSIS — Z794 Long term (current) use of insulin: Secondary | ICD-10-CM

## 2023-09-07 ENCOUNTER — Other Ambulatory Visit: Payer: Self-pay | Admitting: Internal Medicine

## 2023-09-07 DIAGNOSIS — Z794 Long term (current) use of insulin: Secondary | ICD-10-CM

## 2023-09-15 ENCOUNTER — Encounter: Payer: Self-pay | Admitting: Internal Medicine

## 2023-09-15 ENCOUNTER — Ambulatory Visit: Admitting: Internal Medicine

## 2023-09-15 VITALS — BP 134/92 | HR 91 | Temp 98.9°F | Resp 16 | Ht 68.0 in | Wt 352.2 lb

## 2023-09-15 DIAGNOSIS — Z23 Encounter for immunization: Secondary | ICD-10-CM

## 2023-09-15 DIAGNOSIS — I10 Essential (primary) hypertension: Secondary | ICD-10-CM

## 2023-09-15 DIAGNOSIS — E119 Type 2 diabetes mellitus without complications: Secondary | ICD-10-CM | POA: Diagnosis not present

## 2023-09-15 DIAGNOSIS — Z794 Long term (current) use of insulin: Secondary | ICD-10-CM | POA: Diagnosis not present

## 2023-09-15 LAB — MICROALBUMIN / CREATININE URINE RATIO
Creatinine,U: 125.6 mg/dL
Microalb Creat Ratio: UNDETERMINED mg/g (ref 0.0–30.0)
Microalb, Ur: <0.7 mg/dL

## 2023-09-15 LAB — URINALYSIS, ROUTINE W REFLEX MICROSCOPIC
Bilirubin Urine: NEGATIVE
Hgb urine dipstick: NEGATIVE
Ketones, ur: NEGATIVE
Leukocytes,Ua: NEGATIVE
Nitrite: NEGATIVE
RBC / HPF: NONE SEEN (ref 0–?)
Specific Gravity, Urine: 1.02 (ref 1.000–1.030)
Total Protein, Urine: NEGATIVE
Urine Glucose: NEGATIVE
Urobilinogen, UA: 0.2 (ref 0.0–1.0)
WBC, UA: NONE SEEN (ref 0–?)
pH: 6 (ref 5.0–8.0)

## 2023-09-15 MED ORDER — TRULICITY 1.5 MG/0.5ML ~~LOC~~ SOAJ: 1.5000 mg | SUBCUTANEOUS | 0 refills | Status: AC

## 2023-09-15 MED ORDER — TRIAMTERENE-HCTZ 37.5-25 MG PO CAPS: 1.0000 | ORAL_CAPSULE | Freq: Every day | ORAL | 0 refills | Status: DC

## 2023-09-15 NOTE — Patient Instructions (Addendum)
 You received your first Hepatitis B vaccine today. Please return for the second dose in one month.    Hypertension, Adult High blood pressure (hypertension) is when the force of blood pumping through the arteries is too strong. The arteries are the blood vessels that carry blood from the heart throughout the body. Hypertension forces the heart to work harder to pump blood and may cause arteries to become narrow or stiff. Untreated or uncontrolled hypertension can lead to a heart attack, heart failure, a stroke, kidney disease, and other problems. A blood pressure reading consists of a higher number over a lower number. Ideally, your blood pressure should be below 120/80. The first (top) number is called the systolic pressure. It is a measure of the pressure in your arteries as your heart beats. The second (bottom) number is called the diastolic pressure. It is a measure of the pressure in your arteries as the heart relaxes. What are the causes? The exact cause of this condition is not known. There are some conditions that result in high blood pressure. What increases the risk? Certain factors may make you more likely to develop high blood pressure. Some of these risk factors are under your control, including: Smoking. Not getting enough exercise or physical activity. Being overweight. Having too much fat, sugar, calories, or salt (sodium) in your diet. Drinking too much alcohol. Other risk factors include: Having a personal history of heart disease, diabetes, high cholesterol, or kidney disease. Stress. Having a family history of high blood pressure and high cholesterol. Having obstructive sleep apnea. Age. The risk increases with age. What are the signs or symptoms? High blood pressure may not cause symptoms. Very high blood pressure (hypertensive crisis) may cause: Headache. Fast or irregular heartbeats (palpitations). Shortness of breath. Nosebleed. Nausea and vomiting. Vision  changes. Severe chest pain, dizziness, and seizures. How is this diagnosed? This condition is diagnosed by measuring your blood pressure while you are seated, with your arm resting on a flat surface, your legs uncrossed, and your feet flat on the floor. The cuff of the blood pressure monitor will be placed directly against the skin of your upper arm at the level of your heart. Blood pressure should be measured at least twice using the same arm. Certain conditions can cause a difference in blood pressure between your right and left arms. If you have a high blood pressure reading during one visit or you have normal blood pressure with other risk factors, you may be asked to: Return on a different day to have your blood pressure checked again. Monitor your blood pressure at home for 1 week or longer. If you are diagnosed with hypertension, you may have other blood or imaging tests to help your health care provider understand your overall risk for other conditions. How is this treated? This condition is treated by making healthy lifestyle changes, such as eating healthy foods, exercising more, and reducing your alcohol intake. You may be referred for counseling on a healthy diet and physical activity. Your health care provider may prescribe medicine if lifestyle changes are not enough to get your blood pressure under control and if: Your systolic blood pressure is above 130. Your diastolic blood pressure is above 80. Your personal target blood pressure may vary depending on your medical conditions, your age, and other factors. Follow these instructions at home: Eating and drinking  Eat a diet that is high in fiber and potassium, and low in sodium, added sugar, and fat. An example of this  eating plan is called the DASH diet. DASH stands for Dietary Approaches to Stop Hypertension. To eat this way: Eat plenty of fresh fruits and vegetables. Try to fill one half of your plate at each meal with fruits and  vegetables. Eat whole grains, such as whole-wheat pasta, brown rice, or whole-grain bread. Fill about one fourth of your plate with whole grains. Eat or drink low-fat dairy products, such as skim milk or low-fat yogurt. Avoid fatty cuts of meat, processed or cured meats, and poultry with skin. Fill about one fourth of your plate with lean proteins, such as fish, chicken without skin, beans, eggs, or tofu. Avoid pre-made and processed foods. These tend to be higher in sodium, added sugar, and fat. Reduce your daily sodium intake. Many people with hypertension should eat less than 1,500 mg of sodium a day. Do not drink alcohol if: Your health care provider tells you not to drink. You are pregnant, may be pregnant, or are planning to become pregnant. If you drink alcohol: Limit how much you have to: 0-1 drink a day for women. 0-2 drinks a day for men. Know how much alcohol is in your drink. In the [place] one drink equals one 12 oz bottle of beer (355 mL), one 5 oz glass of wine (148 mL), or one 1 oz glass of hard liquor (44 mL). Lifestyle  Work with your health care provider to maintain a healthy body weight or to lose weight. Ask what an ideal weight is for you. Get at least 30 minutes of exercise that causes your heart to beat faster (aerobic exercise) most days of the week. Activities may include walking, swimming, or biking. Include exercise to strengthen your muscles (resistance exercise), such as Pilates or lifting weights, as part of your weekly exercise routine. Try to do these types of exercises for 30 minutes at least 3 days a week. Do not use any products that contain nicotine or tobacco. These products include cigarettes, chewing tobacco, and vaping devices, such as e-cigarettes. If you need help quitting, ask your health care provider. Monitor your blood pressure at home as told by your health care provider. Keep all follow-up visits. This is important. Medicines Take  over-the-counter and prescription medicines only as told by your health care provider. Follow directions carefully. Blood pressure medicines must be taken as prescribed. Do not skip doses of blood pressure medicine. Doing this puts you at risk for problems and can make the medicine less effective. Ask your health care provider about side effects or reactions to medicines that you should watch for. Contact a health care provider if you: Think you are having a reaction to a medicine you are taking. Have headaches that keep coming back (recurring). Feel dizzy. Have swelling in your ankles. Have trouble with your vision. Get help right away if you: Develop a severe headache or confusion. Have unusual weakness or numbness. Feel faint. Have severe pain in your chest or abdomen. Vomit repeatedly. Have trouble breathing. These symptoms may be an emergency. Get help right away. Call 911. Do not wait to see if the symptoms will go away. Do not drive yourself to the hospital. Summary Hypertension is when the force of blood pumping through your arteries is too strong. If this condition is not controlled, it may put you at risk for serious complications. Your personal target blood pressure may vary depending on your medical conditions, your age, and other factors. For most people, a normal blood pressure is less than 120/80.  Hypertension is treated with lifestyle changes, medicines, or a combination of both. Lifestyle changes include losing weight, eating a healthy, low-sodium diet, exercising more, and limiting alcohol. This information is not intended to replace advice given to you by your health care provider. Make sure you discuss any questions you have with your health care provider. Document Revised: 12/16/2020 Document Reviewed: 12/16/2020 Elsevier Patient Education  2024 [employer].

## 2023-09-15 NOTE — Progress Notes (Signed)
 Subjective:  Patient ID: Miranda Stewart, female    DOB: 07/03/1985  Age: 38 y.o. MRN: [id number]  CC: Hypertension and Diabetes   HPI Miranda Stewart presents for f/up ----  Discussed the use of AI scribe software for clinical note transcription with the patient, who gave verbal consent to proceed.  History of Present Illness Miranda Stewart is a 38 year old female with hypertension and type 2 diabetes who presents for medication management and blood pressure monitoring.  She is currently on Trulicity  0.75 mg for type 2 diabetes. She feels good and has no symptoms of hypoglycemia such as dizziness or lightheadedness. No excessive thirst or urination. She has had diarrhea but feels more regular now compared to before. She reports diarrhea but no abdominal pain or cramping from metformin .  She has been monitoring her blood pressure at home using a wrist cuff. In April, her readings were around 125/88 mmHg. In May and June, her readings increased to 127/90 mmHg, 137/94 mmHg, and 129/82 mmHg. On July 12th, her reading was 134/97 mmHg. She is currently taking amlodipine  for hypertension and denies fluid retention.  Her last menstrual cycle was from July 8th to 11th, and she uses Implanon for contraception.    Outpatient Medications Prior to Visit  Medication Sig Dispense Refill   ACCU-CHEK GUIDE TEST test strip TEST 3 TIMES DAILY AS DIRECTED 100 strip 1   Accu-Chek Softclix Lancets lancets USE AS DIRECTED 3 TIMES A DAY 100 each 1   amLODipine  (NORVASC ) 5 MG tablet TAKE 1 TABLET BY MOUTH EVERY DAY 90 tablet 0   Blood Glucose Monitoring Suppl (BLOOD GLUCOSE MONITOR SYSTEM) w/Device KIT 1 each by Does not apply route 3 (three) times daily. May dispense any manufacturer covered by patient's insurance. 1 kit 0   Blood Pressure KIT 1 each by Does not apply route 2 (two) times daily. 1 kit 0   Continuous Glucose Receiver (FREESTYLE LIBRE 3 READER) DEVI 1 Act by Does not apply route daily. 1  each 3   Continuous Glucose Sensor (FREESTYLE LIBRE 3 SENSOR) MISC 1 Act by Does not apply route daily. Place 1 sensor on the skin every 14 days. Use to check glucose continuously 2 each 5   etonogestrel (NEXPLANON) 68 MG IMPL implant 1 each (68 mg total) by Subdermal route. 1 each 0   insulin  aspart (NOVOLOG ) 100 UNIT/ML FlexPen Inject 5 Units into the skin 3 (three) times daily with meals. If eating and Blood Glucose (BG) 80 or higher inject 5 units for meal coverage and add correction dose per scale. If not eating, correction dose only. BG <150= 0 unit; BG 150-200= 1 unit; BG 201-250= 2 unit; BG 251-300= 3 unit; BG 301-350= 4 unit; BG 351-400= 5 unit; BG >400= 6 unit and Call Primary Care. 20 mL 0   Insulin  Pen Needle 31G X 8 MM MISC 1 each by Does not apply route 3 (three) times daily. May dispense any manufacturer covered by patient's insurance. 100 each 0   Lancet Device MISC Use 1 each 3 (three) times daily. 1 each 0   metFORMIN  (GLUCOPHAGE -XR) 750 MG 24 hr tablet TAKE 2 TABLETS (1,500 MG TOTAL) BY MOUTH EVERY DAY WITH BREAKFAST 180 tablet 0   Dulaglutide  (TRULICITY ) 0.75 MG/0.5ML SOAJ INJECT 0.75 MG SUBCUTANEOUSLY ONE TIME PER WEEK 2 mL 0   LANTUS  SOLOSTAR 100 UNIT/ML Solostar Pen INJECT 35UNITS INTO THE SKIN DAILY 3 mL 3   potassium chloride  SA (KLOR-CON  M) 20 MEQ  tablet Take 2 tablets (40 mEq total) by mouth daily for 3 days. 6 tablet 0   fluconazole  (DIFLUCAN ) 150 MG tablet Take 1 tablet (150 mg total) by mouth daily. (Patient not taking: Reported on 05/12/2023) 1 tablet 0   nystatin  cream (MYCOSTATIN ) Apply 1 Application topically 4 (four) times daily. Apply to affected area every 4-6 hours x 10 days (Patient not taking: Reported on 07/01/2023) 30 g 0   No facility-administered medications prior to visit.    ROS Review of Systems  Objective:  BP (!) 134/92 (BP Location: Left Arm, Patient Position: Sitting, Cuff Size: Large)   Pulse 91   Temp 98.9 F (37.2 C) (Oral)   Ht 5' 8 (1.727  m)   Wt (!) 352 lb 3.2 oz (159.8 kg)   SpO2 97%   BMI 53.55 kg/m   BP Readings from Last 3 Encounters:  09/15/23 (!) 134/92  05/23/23 130/74  05/14/23 125/63    Wt Readings from Last 3 Encounters:  09/15/23 (!) 352 lb 3.2 oz (159.8 kg)  05/23/23 (!) 343 lb (155.6 kg)  05/12/23 (!) 328 lb (148.8 kg)    Physical Exam  Lab Results  Component Value Date   WBC 9.1 05/13/2023   HGB 13.9 05/13/2023   HCT 39.4 05/13/2023   PLT 218 05/13/2023   GLUCOSE 88 09/15/2023   CHOL 191 05/23/2023   TRIG 87.0 05/23/2023   HDL 38.40 (L) 05/23/2023   LDLCALC 136 (H) 05/23/2023   ALT 30 05/23/2023   AST 25 05/23/2023   NA 141 09/15/2023   K 4.3 09/15/2023   CL 108 09/15/2023   CREATININE 0.69 09/15/2023   BUN 13 09/15/2023   CO2 28 09/15/2023   TSH 0.95 05/23/2023   HGBA1C 5.9 09/15/2023   MICROALBUR <0.7 09/15/2023    No results found.  Assessment & Plan:  Primary hypertension -     Basic metabolic panel with GFR; Future -     Urinalysis, Routine w reflex microscopic; Future -     Triamterene -HCTZ; Take 1 each (1 capsule total) by mouth daily.  Dispense: 90 capsule; Refill: 0  Insulin -requiring or dependent type II diabetes mellitus (HCC) -     Basic metabolic panel with GFR; Future -     Hemoglobin A1c; Future -     Urinalysis, Routine w reflex microscopic; Future -     Microalbumin / creatinine urine ratio; Future -     Trulicity ; Inject 1.5 mg into the skin once a week.  Dispense: 2 mL; Refill: 0 -     Ambulatory referral to Ophthalmology -     Lantus  SoloStar; Inject 10 Units into the skin daily.  Dispense: 9 mL; Refill: 0  Immunization due -     Heplisav-B  (HepB-CPG) Vaccine     Follow-up: Return in about 3 months (around 12/16/2023).  Debby Molt, MD

## 2023-09-16 LAB — BASIC METABOLIC PANEL WITH GFR
BUN: 13 mg/dL (ref 6–23)
CO2: 28 meq/L (ref 19–32)
Calcium: 9.4 mg/dL (ref 8.4–10.5)
Chloride: 108 meq/L (ref 96–112)
Creatinine, Ser: 0.69 mg/dL (ref 0.40–1.20)
GFR: 110.42 mL/min (ref >60.00)
Glucose, Bld: 88 mg/dL (ref 70–99)
Potassium: 4.3 meq/L (ref 3.5–5.1)
Sodium: 141 meq/L (ref 135–145)

## 2023-09-16 LAB — HEMOGLOBIN A1C: Hgb A1c MFr Bld: 5.9 % (ref 4.6–6.5)

## 2023-09-17 ENCOUNTER — Ambulatory Visit: Payer: Self-pay | Admitting: Internal Medicine

## 2023-09-17 MED ORDER — LANTUS SOLOSTAR 100 UNIT/ML ~~LOC~~ SOPN: 10.0000 [IU] | PEN_INJECTOR | Freq: Every day | SUBCUTANEOUS | 0 refills | Status: DC

## 2023-09-28 ENCOUNTER — Telehealth: Payer: Self-pay | Admitting: *Deleted

## 2023-09-28 NOTE — Progress Notes (Signed)
 Care Guide Pharmacy Note  09/28/2023 Name: [name] MRN: [id number] DOB: 1985-11-22  Referred By: Joshua Debby CROME, MD Reason for referral: Call Attempt #1 and Complex Care Management (Outreach to schedule referral with pharmacist )   Miranda Stewart is a 38 y.o. year old female who is a primary care patient of Joshua Debby CROME, MD.  Miranda Stewart was referred to the pharmacist for assistance related to: DMII  Successful contact was made with the patient to discuss pharmacy services including being ready for the pharmacist to call at least 5 minutes before the scheduled appointment time and to have medication bottles and any blood pressure readings ready for review. The patient agreed to meet with the pharmacist via telephone visit on 10/19/2023  Thedford Franks, CMA [place]  [facility], [facility] Guide Direct Dial: [phone]  Fax: [phone] Website: [email or web address]

## 2023-09-28 NOTE — Progress Notes (Signed)
 Care Guide Pharmacy Note  09/28/2023 Name: Miranda Stewart MRN: [id number] DOB: 01/22/1986  Referred By: Joshua Debby CROME, MD Reason for referral: Call Attempt #1 and Complex Care Management (Outreach to schedule referral with pharmacist )   Miranda Stewart is a 38 y.o. year old female who is a primary care patient of Joshua Debby CROME, MD.  Miranda Stewart was referred to the pharmacist for assistance related to: DMII  An unsuccessful telephone outreach was attempted today to contact the patient who was referred to the pharmacy team for assistance with medication management. Additional attempts will be made to contact the patient.  Thedford Franks, CMA [place]  [facility], [facility] Guide Direct Dial: [phone]  Fax: [phone] Website: [email or web address]

## 2023-09-30 ENCOUNTER — Other Ambulatory Visit: Payer: Self-pay | Admitting: Internal Medicine

## 2023-10-17 ENCOUNTER — Ambulatory Visit

## 2023-10-19 ENCOUNTER — Other Ambulatory Visit

## 2023-10-26 ENCOUNTER — Other Ambulatory Visit

## 2023-11-02 ENCOUNTER — Telehealth: Payer: Self-pay | Admitting: *Deleted

## 2023-11-02 NOTE — Progress Notes (Signed)
 Complex Care Management Care Guide Note  11/02/2023 Name: Miranda Stewart MRN: [id number] DOB: 06-Oct-1985  Miranda Stewart is a 38 y.o. year old female who is a primary care patient of Joshua Debby CROME, MD and is actively engaged with the care management team. I reached out to Miranda Stewart by phone today to assist with re-scheduling  with the Pharmacist.  Follow up plan: pt declined to reschedule at this time - says endo is following her current DM medications. Contact info given if services needed in the future   Thedford Franks, CMA [place]  [facility], [facility] Guide Direct Dial: [phone]  Fax: [phone] Website: [email or web address]

## 2023-12-08 ENCOUNTER — Other Ambulatory Visit: Payer: Self-pay | Admitting: Internal Medicine

## 2023-12-08 DIAGNOSIS — I10 Essential (primary) hypertension: Secondary | ICD-10-CM

## 2023-12-20 ENCOUNTER — Ambulatory Visit: Payer: Self-pay | Admitting: Internal Medicine

## 2023-12-20 ENCOUNTER — Ambulatory Visit (INDEPENDENT_AMBULATORY_CARE_PROVIDER_SITE_OTHER): Admitting: Internal Medicine

## 2023-12-20 ENCOUNTER — Encounter: Payer: Self-pay | Admitting: Internal Medicine

## 2023-12-20 VITALS — BP 118/78 | HR 96 | Temp 98.4°F | Resp 16 | Ht 68.0 in | Wt 336.8 lb

## 2023-12-20 DIAGNOSIS — Z23 Encounter for immunization: Secondary | ICD-10-CM

## 2023-12-20 DIAGNOSIS — Z6841 Body Mass Index (BMI) 40.0 and over, adult: Secondary | ICD-10-CM | POA: Diagnosis not present

## 2023-12-20 DIAGNOSIS — I119 Hypertensive heart disease without heart failure: Secondary | ICD-10-CM

## 2023-12-20 DIAGNOSIS — I10 Essential (primary) hypertension: Secondary | ICD-10-CM

## 2023-12-20 LAB — BASIC METABOLIC PANEL WITH GFR
BUN: 13 mg/dL (ref 6–23)
CO2: 27 meq/L (ref 19–32)
Calcium: 9.3 mg/dL (ref 8.4–10.5)
Chloride: 104 meq/L (ref 96–112)
Creatinine, Ser: 0.74 mg/dL (ref 0.40–1.20)
GFR: 102.75 mL/min (ref >60.00)
Glucose, Bld: 88 mg/dL (ref 70–99)
Potassium: 4.3 meq/L (ref 3.5–5.1)
Sodium: 139 meq/L (ref 135–145)

## 2023-12-20 LAB — CBC WITH DIFFERENTIAL/PLATELET
Basophils Absolute: 0 K/uL (ref 0.0–0.1)
Basophils Relative: 0.3 % (ref 0.0–3.0)
Eosinophils Absolute: 0.1 K/uL (ref 0.0–0.7)
Eosinophils Relative: 1.4 % (ref 0.0–5.0)
HCT: 39.6 % (ref 36.0–46.0)
Hemoglobin: 13.3 g/dL (ref 12.0–15.0)
Lymphocytes Relative: 33.7 % (ref 12.0–46.0)
Lymphs Abs: 2.6 K/uL (ref 0.7–4.0)
MCHC: 33.6 g/dL (ref 30.0–36.0)
MCV: 80.2 fl (ref 78.0–100.0)
Monocytes Absolute: 0.7 K/uL (ref 0.1–1.0)
Monocytes Relative: 8.4 % (ref 3.0–12.0)
Neutro Abs: 4.4 K/uL (ref 1.4–7.7)
Neutrophils Relative %: 56.2 % (ref 43.0–77.0)
Platelets: 302 K/uL (ref 150.0–400.0)
RBC: 4.93 Mil/uL (ref 3.87–5.11)
RDW: 13.6 % (ref 11.5–15.5)
WBC: 7.8 K/uL (ref 4.0–10.5)

## 2023-12-20 MED ORDER — COVID-19 MRNA VAC-TRIS(PFIZER) 30 MCG/0.3ML IM SUSY: 0.3000 mL | PREFILLED_SYRINGE | Freq: Once | INTRAMUSCULAR | 0 refills | Status: AC

## 2023-12-20 NOTE — Progress Notes (Unsigned)
 Subjective:  Patient ID: Miranda Stewart, female    DOB: Jul 01, 1985  Age: 38 y.o. MRN: [id number]  CC: Hypertension   HPI Miranda Stewart presents for f/up --   She feels well. Has lost weight and lowered her A1C with lifestyle modifications.   Outpatient Medications Prior to Visit  Medication Sig Dispense Refill   ACCU-CHEK GUIDE TEST test strip TEST 3 TIMES DAILY AS DIRECTED 100 strip 1   Accu-Chek Softclix Lancets lancets USE AS DIRECTED 3 TIMES A DAY 100 each 1   Blood Glucose Monitoring Suppl (BLOOD GLUCOSE MONITOR SYSTEM) w/Device KIT 1 each by Does not apply route 3 (three) times daily. May dispense any manufacturer covered by patient's insurance. 1 kit 0   Blood Pressure KIT 1 each by Does not apply route 2 (two) times daily. 1 kit 0   Continuous Glucose Receiver (FREESTYLE LIBRE 3 READER) DEVI 1 Act by Does not apply route daily. 1 each 3   Continuous Glucose Sensor (FREESTYLE LIBRE 3 SENSOR) MISC 1 Act by Does not apply route daily. Place 1 sensor on the skin every 14 days. Use to check glucose continuously 2 each 5   Dulaglutide  (TRULICITY ) 1.5 MG/0.5ML SOAJ Inject 1.5 mg into the skin once a week. 2 mL 0   etonogestrel (NEXPLANON) 68 MG IMPL implant 1 each (68 mg total) by Subdermal route. 1 each 0   Insulin  Pen Needle 31G X 8 MM MISC 1 each by Does not apply route 3 (three) times daily. May dispense any manufacturer covered by patient's insurance. 100 each 0   Lancet Device MISC Use 1 each 3 (three) times daily. 1 each 0   metFORMIN  (GLUCOPHAGE -XR) 750 MG 24 hr tablet TAKE 2 TABLETS (1,500 MG TOTAL) BY MOUTH EVERY DAY WITH BREAKFAST 180 tablet 0   triamterene -hydrochlorothiazide (DYAZIDE) 37.5-25 MG capsule TAKE 1 CAPSULE BY MOUTH EVERY DAY 90 capsule 0   amLODipine  (NORVASC ) 5 MG tablet TAKE 1 TABLET BY MOUTH EVERY DAY 90 tablet 0   LANTUS  SOLOSTAR 100 UNIT/ML Solostar Pen Inject 10 Units into the skin daily. 9 mL 0   No facility-administered medications prior to  visit.    ROS Review of Systems  Constitutional:  Negative for appetite change, chills, diaphoresis and fatigue.  HENT: Negative.    Eyes: Negative.   Respiratory:  Negative for cough, chest tightness, shortness of breath and wheezing.   Cardiovascular:  Negative for chest pain, palpitations and leg swelling.  Gastrointestinal: Negative.  Negative for abdominal pain, constipation, diarrhea, nausea and vomiting.  Endocrine: Negative.   Genitourinary: Negative.  Negative for difficulty urinating.  Musculoskeletal: Negative.  Negative for arthralgias and myalgias.  Skin: Negative.   Neurological:  Negative for dizziness, weakness and headaches.  Hematological:  Negative for adenopathy. Does not bruise/bleed easily.  Psychiatric/Behavioral: Negative.      Objective:  BP 118/78 (BP Location: Left Arm, Patient Position: Sitting, Cuff Size: Normal)   Pulse 96   Temp 98.4 F (36.9 C) (Oral)   Resp 16   Ht 5' 8 (1.727 m)   Wt (!) 336 lb 12.8 oz (152.8 kg)   LMP 12/12/2023   SpO2 96%   BMI 51.21 kg/m   BP Readings from Last 3 Encounters:  12/20/23 118/78  09/15/23 (!) 134/92  05/23/23 130/74    Wt Readings from Last 3 Encounters:  12/20/23 (!) 336 lb 12.8 oz (152.8 kg)  09/15/23 (!) 352 lb 3.2 oz (159.8 kg)  05/23/23 (!) 343 lb (155.6 kg)  Physical Exam Vitals reviewed.  Constitutional:      Appearance: She is obese.  HENT:     Nose: Nose normal.     Mouth/Throat:     Mouth: Mucous membranes are moist.  Eyes:     General: No scleral icterus.    Conjunctiva/sclera: Conjunctivae normal.  Cardiovascular:     Rate and Rhythm: Normal rate and regular rhythm.     Heart sounds: No murmur heard.    No friction rub. No gallop.  Pulmonary:     Effort: Pulmonary effort is normal.     Breath sounds: No stridor. No wheezing, rhonchi or rales.  Abdominal:     General: Abdomen is protuberant. Bowel sounds are normal. There is no distension.     Palpations: Abdomen is soft.  There is no hepatomegaly, splenomegaly or mass.     Tenderness: There is no abdominal tenderness. There is no guarding.  Musculoskeletal:        General: Normal range of motion.     Cervical back: Neck supple.     Right lower leg: No edema.     Left lower leg: No edema.  Lymphadenopathy:     Cervical: No cervical adenopathy.  Skin:    General: Skin is warm and dry.  Neurological:     General: No focal deficit present.     Mental Status: She is alert.  Psychiatric:        Mood and Affect: Mood normal.        Behavior: Behavior normal.     Lab Results  Component Value Date   WBC 7.8 12/20/2023   HGB 13.3 12/20/2023   HCT 39.6 12/20/2023   PLT 302.0 12/20/2023   GLUCOSE 88 12/20/2023   CHOL 191 05/23/2023   TRIG 87.0 05/23/2023   HDL 38.40 (L) 05/23/2023   LDLCALC 136 (H) 05/23/2023   ALT 30 05/23/2023   AST 25 05/23/2023   NA 139 12/20/2023   K 4.3 12/20/2023   CL 104 12/20/2023   CREATININE 0.74 12/20/2023   BUN 13 12/20/2023   CO2 27 12/20/2023   TSH 0.95 05/23/2023   HGBA1C 5.9 09/15/2023   MICROALBUR <0.7 09/15/2023    No results found.  Assessment & Plan:  Need for immunization against influenza -     Flu vaccine trivalent PF, 6mos and older(Flulaval,Afluria,Fluarix,Fluzone )  Morbid obesity (HCC) -     COVID-19 mRNA Vac-TriS(Pfizer); Inject 0.3 mLs into the muscle once for 1 dose.  Dispense: 0.3 mL; Refill: 0  Primary hypertension- Her BP is over-controlled. Will discontinue the CCB. -     CBC with Differential/Platelet; Future -     Basic metabolic panel with GFR; Future  LVH (left ventricular hypertrophy) due to hypertensive disease, without heart failure- No signs of fluid excess.     Follow-up: Return in about 4 months (around 04/21/2024).  Debby Molt, MD

## 2023-12-20 NOTE — Patient Instructions (Signed)
 STOP TAKING AMLODIPINE    Hypertension, Adult High blood pressure (hypertension) is when the force of blood pumping through the arteries is too strong. The arteries are the blood vessels that carry blood from the heart throughout the body. Hypertension forces the heart to work harder to pump blood and may cause arteries to become narrow or stiff. Untreated or uncontrolled hypertension can lead to a heart attack, heart failure, a stroke, kidney disease, and other problems. A blood pressure reading consists of a higher number over a lower number. Ideally, your blood pressure should be below 120/80. The first (top) number is called the systolic pressure. It is a measure of the pressure in your arteries as your heart beats. The second (bottom) number is called the diastolic pressure. It is a measure of the pressure in your arteries as the heart relaxes. What are the causes? The exact cause of this condition is not known. There are some conditions that result in high blood pressure. What increases the risk? Certain factors may make you more likely to develop high blood pressure. Some of these risk factors are under your control, including: Smoking. Not getting enough exercise or physical activity. Being overweight. Having too much fat, sugar, calories, or salt (sodium) in your diet. Drinking too much alcohol. Other risk factors include: Having a personal history of heart disease, diabetes, high cholesterol, or kidney disease. Stress. Having a family history of high blood pressure and high cholesterol. Having obstructive sleep apnea. Age. The risk increases with age. What are the signs or symptoms? High blood pressure may not cause symptoms. Very high blood pressure (hypertensive crisis) may cause: Headache. Fast or irregular heartbeats (palpitations). Shortness of breath. Nosebleed. Nausea and vomiting. Vision changes. Severe chest pain, dizziness, and seizures. How is this  diagnosed? This condition is diagnosed by measuring your blood pressure while you are seated, with your arm resting on a flat surface, your legs uncrossed, and your feet flat on the floor. The cuff of the blood pressure monitor will be placed directly against the skin of your upper arm at the level of your heart. Blood pressure should be measured at least twice using the same arm. Certain conditions can cause a difference in blood pressure between your right and left arms. If you have a high blood pressure reading during one visit or you have normal blood pressure with other risk factors, you may be asked to: Return on a different day to have your blood pressure checked again. Monitor your blood pressure at home for 1 week or longer. If you are diagnosed with hypertension, you may have other blood or imaging tests to help your health care provider understand your overall risk for other conditions. How is this treated? This condition is treated by making healthy lifestyle changes, such as eating healthy foods, exercising more, and reducing your alcohol intake. You may be referred for counseling on a healthy diet and physical activity. Your health care provider may prescribe medicine if lifestyle changes are not enough to get your blood pressure under control and if: Your systolic blood pressure is above 130. Your diastolic blood pressure is above 80. Your personal target blood pressure may vary depending on your medical conditions, your age, and other factors. Follow these instructions at home: Eating and drinking  Eat a diet that is high in fiber and potassium, and low in sodium, added sugar, and fat. An example of this eating plan is called the DASH diet. DASH stands for Dietary Approaches to Stop Hypertension.  To eat this way: Eat plenty of fresh fruits and vegetables. Try to fill one half of your plate at each meal with fruits and vegetables. Eat whole grains, such as whole-wheat pasta, brown  rice, or whole-grain bread. Fill about one fourth of your plate with whole grains. Eat or drink low-fat dairy products, such as skim milk or low-fat yogurt. Avoid fatty cuts of meat, processed or cured meats, and poultry with skin. Fill about one fourth of your plate with lean proteins, such as fish, chicken without skin, beans, eggs, or tofu. Avoid pre-made and processed foods. These tend to be higher in sodium, added sugar, and fat. Reduce your daily sodium intake. Many people with hypertension should eat less than 1,500 mg of sodium a day. Do not drink alcohol if: Your health care provider tells you not to drink. You are pregnant, may be pregnant, or are planning to become pregnant. If you drink alcohol: Limit how much you have to: 0-1 drink a day for women. 0-2 drinks a day for men. Know how much alcohol is in your drink. In the [place] one drink equals one 12 oz bottle of beer (355 mL), one 5 oz glass of wine (148 mL), or one 1 oz glass of hard liquor (44 mL). Lifestyle  Work with your health care provider to maintain a healthy body weight or to lose weight. Ask what an ideal weight is for you. Get at least 30 minutes of exercise that causes your heart to beat faster (aerobic exercise) most days of the week. Activities may include walking, swimming, or biking. Include exercise to strengthen your muscles (resistance exercise), such as Pilates or lifting weights, as part of your weekly exercise routine. Try to do these types of exercises for 30 minutes at least 3 days a week. Do not use any products that contain nicotine or tobacco. These products include cigarettes, chewing tobacco, and vaping devices, such as e-cigarettes. If you need help quitting, ask your health care provider. Monitor your blood pressure at home as told by your health care provider. Keep all follow-up visits. This is important. Medicines Take over-the-counter and prescription medicines only as told by your health care  provider. Follow directions carefully. Blood pressure medicines must be taken as prescribed. Do not skip doses of blood pressure medicine. Doing this puts you at risk for problems and can make the medicine less effective. Ask your health care provider about side effects or reactions to medicines that you should watch for. Contact a health care provider if you: Think you are having a reaction to a medicine you are taking. Have headaches that keep coming back (recurring). Feel dizzy. Have swelling in your ankles. Have trouble with your vision. Get help right away if you: Develop a severe headache or confusion. Have unusual weakness or numbness. Feel faint. Have severe pain in your chest or abdomen. Vomit repeatedly. Have trouble breathing. These symptoms may be an emergency. Get help right away. Call 911. Do not wait to see if the symptoms will go away. Do not drive yourself to the hospital. Summary Hypertension is when the force of blood pumping through your arteries is too strong. If this condition is not controlled, it may put you at risk for serious complications. Your personal target blood pressure may vary depending on your medical conditions, your age, and other factors. For most people, a normal blood pressure is less than 120/80. Hypertension is treated with lifestyle changes, medicines, or a combination of both. Lifestyle changes include  losing weight, eating a healthy, low-sodium diet, exercising more, and limiting alcohol. This information is not intended to replace advice given to you by your health care provider. Make sure you discuss any questions you have with your health care provider. Document Revised: 12/16/2020 Document Reviewed: 12/16/2020 Elsevier Patient Education  2024 [employer].

## 2023-12-21 DIAGNOSIS — Z23 Encounter for immunization: Secondary | ICD-10-CM | POA: Diagnosis not present

## 2023-12-31 ENCOUNTER — Other Ambulatory Visit: Payer: Self-pay | Admitting: Internal Medicine

## 2024-03-10 ENCOUNTER — Other Ambulatory Visit: Payer: Self-pay | Admitting: Internal Medicine

## 2024-03-10 DIAGNOSIS — I10 Essential (primary) hypertension: Secondary | ICD-10-CM

## 2024-04-23 ENCOUNTER — Ambulatory Visit: Admitting: Internal Medicine
